# Patient Record
Sex: Male | Born: 1970 | Race: White | Hispanic: No | Marital: Married | Smoking: Never smoker
Health system: Southern US, Community
[De-identification: ages and names within clinical notes are randomized; demographics above are authoritative.]

## PROBLEM LIST (undated history)

## (undated) DIAGNOSIS — R002 Palpitations: Secondary | ICD-10-CM

## (undated) DIAGNOSIS — F419 Anxiety disorder, unspecified: Secondary | ICD-10-CM

## (undated) DIAGNOSIS — K219 Gastro-esophageal reflux disease without esophagitis: Secondary | ICD-10-CM

## (undated) DIAGNOSIS — R1013 Epigastric pain: Secondary | ICD-10-CM

## (undated) HISTORY — DX: Epigastric pain: R10.13

---

## 2004-08-09 ENCOUNTER — Ambulatory Visit: Payer: Self-pay | Admitting: Cardiology

## 2004-08-31 ENCOUNTER — Ambulatory Visit: Payer: Self-pay | Admitting: Cardiology

## 2004-11-13 ENCOUNTER — Encounter: Admission: RE | Admit: 2004-11-13 | Discharge: 2004-11-13 | Payer: Self-pay | Admitting: Neurological Surgery

## 2011-04-23 DIAGNOSIS — M5430 Sciatica, unspecified side: Secondary | ICD-10-CM | POA: Insufficient documentation

## 2011-05-17 ENCOUNTER — Other Ambulatory Visit: Payer: Self-pay | Admitting: Neurosurgery

## 2011-05-17 DIAGNOSIS — Z139 Encounter for screening, unspecified: Secondary | ICD-10-CM

## 2011-05-18 ENCOUNTER — Ambulatory Visit
Admission: RE | Admit: 2011-05-18 | Discharge: 2011-05-18 | Disposition: A | Discharge status: Home / Self Care | Payer: BC Managed Care – PPO | Source: Ambulatory Visit | Attending: Neurosurgery | Admitting: Neurosurgery

## 2011-05-18 DIAGNOSIS — Z139 Encounter for screening, unspecified: Secondary | ICD-10-CM

## 2011-05-22 ENCOUNTER — Other Ambulatory Visit: Payer: Self-pay | Admitting: Neurosurgery

## 2011-05-22 DIAGNOSIS — M549 Dorsalgia, unspecified: Secondary | ICD-10-CM

## 2011-05-30 ENCOUNTER — Ambulatory Visit
Admission: RE | Admit: 2011-05-30 | Discharge: 2011-05-30 | Disposition: A | Discharge status: Home / Self Care | Payer: BC Managed Care – PPO | Source: Ambulatory Visit | Attending: Neurosurgery | Admitting: Neurosurgery

## 2011-05-30 DIAGNOSIS — M549 Dorsalgia, unspecified: Secondary | ICD-10-CM

## 2011-05-30 MED ORDER — MORPHINE SULFATE 2 MG/ML IJ SOLN: 2.0000 mg | INTRAMUSCULAR | Status: DC | PRN

## 2011-05-30 MED ORDER — DIAZEPAM 5 MG PO TABS
5.0000 mg | ORAL_TABLET | Freq: Once | ORAL | Status: AC
  Administered 2011-05-30: 5 mg via ORAL

## 2011-05-30 MED ORDER — IOHEXOL 180 MG/ML  SOLN
20.0000 mL | Freq: Once | INTRAMUSCULAR | Status: AC | PRN
  Administered 2011-05-30: 20 mL via INTRATHECAL

## 2011-05-30 NOTE — Progress Notes (Addendum)
Explained discharge instructions to pt and wife, consent signed and valium to be given, pt did take his ativan this am.  0720 Dr. Phoebe Perch in to visit.

## 2011-05-31 ENCOUNTER — Telehealth: Payer: Self-pay | Admitting: Neurosurgery

## 2011-05-31 NOTE — Telephone Encounter (Signed)
Patient's wife called to report patient having HAs when he stands up and that go away when he lies down.  She states he already is doing another 24 hours of strict bedrest and forcing fluids, but she is wondering about a Blood Patch.  Told her to go ahead and inform Dr. Phoebe Perch of symptoms and to ask if he'd fax [place] order for BP so that if HA persisted in morning we could proceed with BP.  jkl

## 2011-06-01 ENCOUNTER — Other Ambulatory Visit: Payer: Self-pay | Admitting: Neurosurgery

## 2011-06-01 ENCOUNTER — Ambulatory Visit
Admission: RE | Admit: 2011-06-01 | Discharge: 2011-06-01 | Disposition: A | Discharge status: Home / Self Care | Payer: BC Managed Care – PPO | Source: Ambulatory Visit | Attending: Neurosurgery | Admitting: Neurosurgery

## 2011-06-01 DIAGNOSIS — G971 Other reaction to spinal and lumbar puncture: Secondary | ICD-10-CM

## 2011-06-01 MED ORDER — IOHEXOL 180 MG/ML  SOLN
1.0000 mL | Freq: Once | INTRAMUSCULAR | Status: AC | PRN
  Administered 2011-06-01: 1 mL via EPIDURAL

## 2011-06-01 NOTE — Progress Notes (Signed)
Pt has no c/o post blood patch.  Discharge instructions reviewed with pt and wife.

## 2011-12-11 DIAGNOSIS — R002 Palpitations: Secondary | ICD-10-CM | POA: Insufficient documentation

## 2011-12-11 DIAGNOSIS — M545 Low back pain, unspecified: Secondary | ICD-10-CM | POA: Insufficient documentation

## 2011-12-21 DIAGNOSIS — R079 Chest pain, unspecified: Secondary | ICD-10-CM

## 2015-07-01 DIAGNOSIS — K612 Anorectal abscess: Secondary | ICD-10-CM | POA: Insufficient documentation

## 2016-01-28 ENCOUNTER — Emergency Department (HOSPITAL_COMMUNITY)
Admission: EM | Admit: 2016-01-28 | Discharge: 2016-01-28 | Disposition: A | Discharge status: Home / Self Care | Payer: BLUE CROSS/BLUE SHIELD | Attending: Emergency Medicine | Admitting: Emergency Medicine

## 2016-01-28 ENCOUNTER — Emergency Department (HOSPITAL_COMMUNITY): Payer: BLUE CROSS/BLUE SHIELD

## 2016-01-28 ENCOUNTER — Encounter (HOSPITAL_COMMUNITY): Payer: Self-pay

## 2016-01-28 DIAGNOSIS — S60931A Unspecified superficial injury of right thumb, initial encounter: Secondary | ICD-10-CM | POA: Insufficient documentation

## 2016-01-28 DIAGNOSIS — Z23 Encounter for immunization: Secondary | ICD-10-CM | POA: Insufficient documentation

## 2016-01-28 DIAGNOSIS — Y999 Unspecified external cause status: Secondary | ICD-10-CM | POA: Insufficient documentation

## 2016-01-28 DIAGNOSIS — Y929 Unspecified place or not applicable: Secondary | ICD-10-CM | POA: Insufficient documentation

## 2016-01-28 DIAGNOSIS — S6991XA Unspecified injury of right wrist, hand and finger(s), initial encounter: Secondary | ICD-10-CM

## 2016-01-28 DIAGNOSIS — Y939 Activity, unspecified: Secondary | ICD-10-CM | POA: Insufficient documentation

## 2016-01-28 DIAGNOSIS — W25XXXA Contact with sharp glass, initial encounter: Secondary | ICD-10-CM | POA: Diagnosis not present

## 2016-01-28 HISTORY — DX: Anxiety disorder, unspecified: F41.9

## 2016-01-28 MED ORDER — TETANUS-DIPHTH-ACELL PERTUSSIS 5-2.5-18.5 LF-MCG/0.5 IM SUSP
0.5000 mL | Freq: Once | INTRAMUSCULAR | Status: AC
  Administered 2016-01-28: 0.5 mL via INTRAMUSCULAR
  Filled 2016-01-28: qty 0.5

## 2016-01-28 MED ORDER — HYDROCODONE-ACETAMINOPHEN 5-325 MG PO TABS: 1.0000 | ORAL_TABLET | Freq: Four times a day (QID) | ORAL | 0 refills | Status: DC | PRN

## 2016-01-28 MED ORDER — SODIUM CHLORIDE 0.9 % IV SOLN
3.0000 g | Freq: Once | INTRAVENOUS | Status: AC
  Administered 2016-01-28: 3 g via INTRAVENOUS
  Filled 2016-01-28: qty 3

## 2016-01-28 MED ORDER — SULFAMETHOXAZOLE-TRIMETHOPRIM 800-160 MG PO TABS: 1.0000 | ORAL_TABLET | Freq: Two times a day (BID) | ORAL | 0 refills | Status: AC

## 2016-01-28 MED ORDER — IBUPROFEN 400 MG PO TABS
800.0000 mg | ORAL_TABLET | Freq: Once | ORAL | Status: AC
Start: 2016-01-28 — End: 2016-01-28
  Administered 2016-01-28: 800 mg via ORAL
  Filled 2016-01-28: qty 2

## 2016-01-28 MED ORDER — LIDOCAINE HCL (PF) 1 % IJ SOLN
5.0000 mL | Freq: Once | INTRAMUSCULAR | Status: AC
  Administered 2016-01-28: 5 mL via INTRADERMAL
  Filled 2016-01-28: qty 5

## 2016-01-28 MED ORDER — CEPHALEXIN 500 MG PO CAPS: 500.0000 mg | ORAL_CAPSULE | Freq: Four times a day (QID) | ORAL | 0 refills | Status: DC

## 2016-01-28 NOTE — ED Triage Notes (Signed)
Patient here with right thumb pain x 2 days. Had laceration to same 2 days ago and then hit with hammer yesterday.  Small amount purulent drainage noted. No bleeding

## 2016-01-28 NOTE — ED Provider Notes (Signed)
[facility]PT Provider Note   CSN: [id number] Arrival date & time: 01/28/16  1840  By signing my name below, I, Modena JanskyAlbert Thayil, attest that this documentation has been prepared under the direction and in the presence of non-physician practitioner, Roxy Horsemanobert Jeromie Gainor, PA-C. Electronically Signed: Modena JanskyAlbert Thayil, Scribe. 01/28/2016. 6:54 PM.  History   Chief Complaint Chief Complaint  Patient presents with  . Finger Injury   The history is provided by the patient. No language interpreter was used.   HPI Comments: Steven Rivas is a 45 y.o. male who presents to the Emergency Department complaining of constant moderate right thumb pain that started 2 days ago. He reports he accidentally cut his thumb with glass 2 days ago and he also hit his right thumb with a hammer yesterday. He went to Urgent Care today and was sent to the ED. Associated symptoms include a wound and swelling to the injury site. His last tetanus shot was 7 years ago. Denies any other complaints.    Past Medical History:  Diagnosis Date  . Anxiety     There are no active problems to display for this patient.   History reviewed. No pertinent surgical history.     Home Medications    Prior to Admission medications   Not on File    Family History No family history on file.  Social History Social History  Substance Use Topics  . Smoking status: Never Smoker  . Smokeless tobacco: Never Used  . Alcohol use Not on file     Allergies   Review of patient's allergies indicates no known allergies.   Review of Systems Review of Systems  Constitutional: Negative for chills and fever.  Musculoskeletal: Positive for joint swelling and myalgias.  Skin: Positive for wound.  All other systems reviewed and are negative.    Physical Exam Updated Vital Signs BP 135/95 (BP Location: Left Arm)   Pulse 63   Temp 98.6 F (37 C) (Oral)   Resp 14   SpO2 99%   Physical Exam Physical Exam  Constitutional:  Pt appears well-developed and well-nourished. No distress.  HENT:  Head: Normocephalic and atraumatic.  Eyes: Conjunctivae are normal.  Neck: Normal range of motion.  Cardiovascular: Normal rate, regular rhythm and intact distal pulses.   Capillary refill < 3 sec  Pulmonary/Chest: Effort normal and breath sounds normal.  Musculoskeletal: Pt exhibits tenderness to palpation over posterior and inferior aspects, majority of pain at the location of the laceration from 2 days ago, mild purulent discharge. Pt exhibits no edema.  ROM: 3/5 limited by pain Neurological: Pt  is alert. Coordination normal.  Sensation 5/5 Strength 3/5 limited by pain  Skin: Skin is warm and dry. Pt is not diaphoretic.  No tenting of the skin Mild to moderate swelling, no erythema or cellulitis, some discharge from the laceration site from 2 days ago  Psychiatric: Pt has a normal mood and affect.  Nursing note and vitals reviewed.   ED Treatments / Results  DIAGNOSTIC STUDIES: Oxygen Saturation is 98% on RA, normal by my interpretation.    COORDINATION OF CARE: 6:58 PM- Pt advised of plan for treatment and pt agrees.  Labs (all labs ordered are listed, but only abnormal results are displayed) Labs Reviewed - No data to display  EKG  EKG Interpretation None       Radiology No results found.  Procedures Procedures (including critical care time)  Medications Ordered in ED Medications  Tdap (BOOSTRIX) injection 0.5 mL (not administered)  Initial Impression / Assessment and Plan / ED Course  I have reviewed the triage vital signs and the nursing notes.  Pertinent labs & imaging results that were available during my care of the patient were reviewed by me and considered in my medical decision making (see chart for details).  Clinical Course    Patient seen by and discussed with Dr. Adriana Simas, who recommends lancing the posterior aspect of the the thumb, giving a dose of IV abx and outpatient  trial.  If symptoms worsen, he will need to return for evaluation by hand surgery.  Patient understands and agrees with this plan.  Tdap updated.  INCISION AND DRAINAGE Performed by: Roxy Horseman Consent: Verbal consent obtained. Risks and benefits: risks, benefits and alternatives were discussed Type: abscess  Body area: right thumb  Anesthesia: Digital block  Incision was made with a scalpel.  Local anesthetic: lidocaine 1% without epinephrine  Anesthetic total: 4 ml  Complexity: complex Blunt dissection to break up loculations  Drainage: purulent  Drainage amount: mild  Packing material: not packed  Patient tolerance: Patient tolerated the procedure well with no immediate complications.   Will place patient in splint.  DC to home with bactrim and keflex.  Wound check in 2 days if not better, or sooner if symptoms worsen.    Final Clinical Impressions(s) / ED Diagnoses   Final diagnoses:  Finger injury, right, initial encounter    New Prescriptions Discharge Medication List as of 01/28/2016 10:18 PM    START taking these medications   Details  cephALEXin (KEFLEX) 500 MG capsule Take 1 capsule (500 mg total) by mouth 4 (four) times daily., Starting Sat 01/28/2016, Print    sulfamethoxazole-trimethoprim (BACTRIM DS,SEPTRA DS) 800-160 MG tablet Take 1 tablet by mouth 2 (two) times daily., Starting Sat 01/28/2016, Until Sat 02/04/2016, Print       I personally performed the services described in this documentation, which was scribed in my presence. The recorded information has been reviewed and is accurate.       Roxy Horseman, PA-C 01/28/16 0454    Donnetta Hutching, MD 01/31/16 [phone]

## 2016-01-28 NOTE — Progress Notes (Signed)
Pharmacy Antibiotic Note  Steven Rivas is a 45 y.o. male admitted on 01/28/2016 with thumb injury,  Pharmacy has been consulted for Unasyn dosing.  Planning I&D of thumb and then d/c home.  Plan: 1. Unasyn 3g IV x1 now. 2. Pharmacy will sign-off, please contact if questions.     Temp (24hrs), Avg:98.6 F (37 C), Min:98.6 F (37 C), Max:98.6 F (37 C)  No results for input(s): WBC, CREATININE, LATICACIDVEN, VANCOTROUGH, VANCOPEAK, VANCORANDOM, GENTTROUGH, GENTPEAK, GENTRANDOM, TOBRATROUGH, TOBRAPEAK, TOBRARND, AMIKACINPEAK, AMIKACINTROU, AMIKACIN in the last 168 hours.  CrCl cannot be calculated (Unknown ideal weight.).    No Known Allergies  Antimicrobials this admission: 9/16 Unasyn x 1  Dose adjustments this admission:   Microbiology results:   Thank you for allowing pharmacy to be a part of this patient's care.  Tad MooreJessica Nasim Habeeb, Pharm D, BCPS  Clinical Pharmacist Pager [phone]  01/28/2016 9:06 PM

## 2016-01-29 ENCOUNTER — Emergency Department (HOSPITAL_COMMUNITY)
Admission: EM | Admit: 2016-01-29 | Discharge: 2016-01-29 | Disposition: A | Discharge status: Home / Self Care | Payer: BLUE CROSS/BLUE SHIELD | Attending: Emergency Medicine | Admitting: Emergency Medicine

## 2016-01-29 ENCOUNTER — Encounter (HOSPITAL_COMMUNITY): Payer: Self-pay

## 2016-01-29 DIAGNOSIS — L089 Local infection of the skin and subcutaneous tissue, unspecified: Secondary | ICD-10-CM | POA: Diagnosis not present

## 2016-01-29 LAB — CBC WITH DIFFERENTIAL/PLATELET
BASOS ABS: 0 10*3/uL (ref 0.0–0.1)
BASOS PCT: 0 %
EOS ABS: 0.1 10*3/uL (ref 0.0–0.7)
EOS PCT: 1 %
HCT: 42.6 % (ref 39.0–52.0)
Hemoglobin: 14.5 g/dL (ref 13.0–17.0)
Lymphocytes Relative: 13 %
Lymphs Abs: 1.4 10*3/uL (ref 0.7–4.0)
MCH: 30 pg (ref 26.0–34.0)
MCHC: 34 g/dL (ref 30.0–36.0)
MCV: 88.2 fL (ref 78.0–100.0)
MONO ABS: 0.8 10*3/uL (ref 0.1–1.0)
MONOS PCT: 8 %
Neutro Abs: 8.2 10*3/uL — ABNORMAL HIGH (ref 1.7–7.7)
Neutrophils Relative %: 78 %
PLATELETS: 174 10*3/uL (ref 150–400)
RBC: 4.83 MIL/uL (ref 4.22–5.81)
RDW: 12.8 % (ref 11.5–15.5)
WBC: 10.4 10*3/uL (ref 4.0–10.5)

## 2016-01-29 LAB — BASIC METABOLIC PANEL
Anion gap: 9 (ref 5–15)
BUN: 12 mg/dL (ref 6–20)
CALCIUM: 8.9 mg/dL (ref 8.9–10.3)
CHLORIDE: 106 mmol/L (ref 101–111)
CO2: 23 mmol/L (ref 22–32)
CREATININE: 1.02 mg/dL (ref 0.61–1.24)
Glucose, Bld: 87 mg/dL (ref 65–99)
Potassium: 3.5 mmol/L (ref 3.5–5.1)
SODIUM: 138 mmol/L (ref 135–145)

## 2016-01-29 LAB — I-STAT CG4 LACTIC ACID, ED: LACTIC ACID, VENOUS: 0.91 mmol/L (ref 0.5–1.9)

## 2016-01-29 MED ORDER — LIDOCAINE HCL (PF) 1 % IJ SOLN
10.0000 mL | Freq: Once | INTRAMUSCULAR | Status: AC
  Administered 2016-01-29: 10 mL

## 2016-01-29 MED ORDER — LIDOCAINE HCL (PF) 1 % IJ SOLN
INTRAMUSCULAR | Status: AC
  Administered 2016-01-29: 10 mL
  Filled 2016-01-29: qty 10

## 2016-01-29 MED ORDER — ACETAMINOPHEN 500 MG PO TABS
1000.0000 mg | ORAL_TABLET | Freq: Once | ORAL | Status: AC
  Administered 2016-01-29: 1000 mg via ORAL
  Filled 2016-01-29: qty 2

## 2016-01-29 MED ORDER — HYDROCODONE-ACETAMINOPHEN 5-325 MG PO TABS
2.0000 | ORAL_TABLET | Freq: Once | ORAL | Status: DC
  Filled 2016-01-29: qty 2

## 2016-01-29 NOTE — Discharge Instructions (Addendum)
Keep your bandage on, keeping it clean and dry until midday on Tuesday.  At that time, take it off and pull the packing.  Cleanse the wound in the shower under running water with soap and water.  You may apply a new dry bandage over it as needed until there is no further drainage.  Cleanse it daily in the shower. Call Dr. Carollee Massedhompson's office if you have worsening signs of infection (fever greater than 101.5, spreading redness, increasing pain, increasing purulent drainage from the wound, etc.).  Please take ibuprofen, either 600 mg 4 times a day or 800 mg 3 times a day.  If he has pain unrelieved by ibuprofen, you may also take Tylenol according to the instructions on the packaging.   WORK STATUS: NO WORK FROM 01-27-16 THROUGH AT LEAST 01-31-16.  I WILL RE-EVALUATE HIM ON 02-01-16.

## 2016-01-29 NOTE — ED Triage Notes (Signed)
Patient here to have thumb rechecked after having ot drained last pm. Taking antibiotics but today had chills and fever of 100.5 pta. Here to have wound checked, NAD

## 2016-01-29 NOTE — ED Notes (Signed)
Dr. Thompson at bedside. 

## 2016-01-29 NOTE — Consult Note (Signed)
ORTHOPAEDIC CONSULTATION HISTORY & PHYSICAL REQUESTING PHYSICIAN: Steven OctaveStephen Rancour, MD  Chief Complaint: Right thumb infection  HPI: Steven Rivas is a 45 y.o. male who injured the dorsal radial aspect of his right thumb near the level of the IP joint with sheet metal on this past Thursday.  He had some drainage and redness over the subsequent one to 2 days, and as a complicating factor, struck the thumb with a hammer.  Initially he had made efforts to close the wound with superglue.  Ultimately he remove the superglue and more fluid was expressed.  He was evaluated yesterday in emergency department where he had developed a small infectious blister of the skin which was unroofed.  He was placed on Keflex and Bactrim.  He has developed some increased soreness and had a temperature elevation today to 100.5.  Past Medical History:  Diagnosis Date  . Anxiety    History reviewed. No pertinent surgical history. Social History   Social History  . Marital status: Married    Spouse name: N/A  . Number of children: N/A  . Years of education: N/A   Social History Main Topics  . Smoking status: Never Smoker  . Smokeless tobacco: Never Used  . Alcohol use None  . Drug use: Unknown  . Sexual activity: Not Asked   Other Topics Concern  . None   Social History Narrative  . None   No family history on file. No Known Allergies Prior to Admission medications   Medication Sig Start Date End Date Taking? Authorizing Provider  cephALEXin (KEFLEX) 500 MG capsule Take 1 capsule (500 mg total) by mouth 4 (four) times daily. 01/28/16   Roxy Horsemanobert Browning, PA-C  HYDROcodone-acetaminophen (NORCO/VICODIN) 5-325 MG tablet Take 1-2 tablets by mouth every 6 (six) hours as needed. 01/28/16   Roxy Horsemanobert Browning, PA-C  sulfamethoxazole-trimethoprim (BACTRIM DS,SEPTRA DS) 800-160 MG tablet Take 1 tablet by mouth 2 (two) times daily. 01/28/16 02/04/16  Roxy Horsemanobert Browning, PA-C   Dg Finger Thumb Right  Result Date:  01/28/2016 CLINICAL DATA:  Struck RIGHT thumb on piece of brittle metal on Tuesday then smashed thumb with a hammer 2 days ago, swelling, pus drainage at site of injury, question foreign body EXAM: RIGHT THUMB 2+V COMPARISON:  None FINDINGS: Soft tissue swelling RIGHT thumb. Osseous mineralization normal. Joint spaces preserved. No acute fracture, dislocation, or bone destruction. No radiopaque foreign body or soft tissue gas. IMPRESSION: Soft tissue swelling without acute bony abnormality or radiopaque foreign body. Electronically Signed   By: Ulyses SouthwardMark  Boles M.D.   On: 01/28/2016 20:00    Positive ROS: All other systems have been reviewed and were otherwise negative with the exception of those mentioned in the HPI and as above.  Physical Exam: Vitals: Refer to EMR. Constitutional:  WD, WN, NAD HEENT:  NCAT, EOMI Neuro/Psych:  Alert & oriented to person, place, and time; appropriate mood & affect Lymphatic: No generalized extremity edema or lymphadenopathy Extremities / MSK:  The extremities are normal with respect to appearance, ranges of motion, joint stability, muscle strength/tone, sensation, & perfusion except as otherwise noted:  There is a curvilinear wound on the dorsal radial aspect of the right thumb that is proximally based, near the level of the IP joint.  He can actively flex and extend the thumb, and generate good tension.  He reports the most discomfort around the level of the IP flexion crease on the radial side of the thumb and even somewhat volarly.  The pulp is not tense.  There is no fluctuance.  There is no significant spreading redness.  WBC was 10.4.  Assessment: Right thumb laceration, likely with some degree of subcutaneous abscess versus possible septic IP joint  Plan: After obtaining verbal consent, digital block with lidocaine was performed by me.  A tourniquet was applied to the base of the thumb.  At first, I opened the wound with spreading dissection.  Some scant amount  of cloudy thin fluid was obtained.  I extended the wound slightly proximally and distally and further expose the wound.  It did not appear to extend more deeply although I did spread sufficiently encase it was deeper extension.  I also spread into the subcutaneous tissues.  There was no significant extension of the wound or fluid tracking volarly.  I irrigated the wound very copiously and then left the packing.  The tourniquet was released and hemostasis obtained with direct pressure before bandage was applied.  He will be discharged with instructions on wound care, what to do if his condition worsens, and should continue on his antibiotics.  He was instructed in anti-inflammatories supplement with Tylenol and he has a prescription for other analgesics if needed.  We will plan for him to pull the packing on Tuesday and will evaluate him in the office on Wednesday to see whether he can proceed to work on Wednesday evening.  Questions were invited and answered.  Cliffton Asters Janee Morn, MD      [facility] [address] [place], [place]  [address] Office: [phone] Mobile: [phone]  01/29/2016, 10:05 PM

## 2016-01-29 NOTE — ED Provider Notes (Signed)
[facility] Provider Note   CSN: [id number] Arrival date & time: 01/29/16  1805   By signing my name below, I, Christel Mormon, attest that this documentation has been prepared under the direction and in the presence of Roxy Horseman, PA-C. Electronically Signed: Christel Mormon, Scribe. 01/29/2016. 7:02 PM.   History   Chief Complaint Chief Complaint  Patient presents with  . Wound Infection    The history is provided by the patient. No language interpreter was used.   HPI Comments:  Steven Rivas is a 45 y.o. male who presents to the Emergency Department, here to have a wound on his R thumb re-checked after being seen in the ED yesterday. Pt reports throbbing pain and draining from the wound site today. Can move it more. Pt was given antibiotics and states that today after he took the antibiotics he felt ill, began having chills and noted a fever of 100.5. Pt took 4 Advil PTA which lowered his fever. Pt denies any other complaints.   Past Medical History:  Diagnosis Date  . Anxiety     There are no active problems to display for this patient.   History reviewed. No pertinent surgical history.     Home Medications    Prior to Admission medications   Medication Sig Start Date End Date Taking? Authorizing Provider  cephALEXin (KEFLEX) 500 MG capsule Take 1 capsule (500 mg total) by mouth 4 (four) times daily. 01/28/16   Roxy Horseman, PA-C  HYDROcodone-acetaminophen (NORCO/VICODIN) 5-325 MG tablet Take 1-2 tablets by mouth every 6 (six) hours as needed. 01/28/16   Roxy Horseman, PA-C  sulfamethoxazole-trimethoprim (BACTRIM DS,SEPTRA DS) 800-160 MG tablet Take 1 tablet by mouth 2 (two) times daily. 01/28/16 02/04/16  Roxy Horseman, PA-C    Family History No family history on file.  Social History Social History  Substance Use Topics  . Smoking status: Never Smoker  . Smokeless tobacco: Never Used  . Alcohol use Not on file     Allergies   Review of  patient's allergies indicates no known allergies.   Review of Systems Review of Systems  Musculoskeletal: Positive for arthralgias.  Skin: Positive for wound.     Physical Exam Updated Vital Signs BP 136/85 (BP Location: Left Arm)   Pulse 66   Temp 99.1 F (37.3 C) (Oral)   Resp 20   SpO2 97%   Physical Exam  Constitutional: He is oriented to person, place, and time. He appears well-developed and well-nourished.  HENT:  Head: Normocephalic and atraumatic.  Eyes: Conjunctivae and EOM are normal.  Neck: Normal range of motion.  Cardiovascular: Normal rate.   Pulmonary/Chest: Effort normal.  Abdominal: He exhibits no distension.  Musculoskeletal: Normal range of motion.  Pain with passive and active range of motion  Neurological: He is alert and oriented to person, place, and time.  Skin: Skin is dry.  Psychiatric: He has a normal mood and affect. His behavior is normal. Judgment and thought content normal.  Nursing note and vitals reviewed.        ED Treatments / Results  DIAGNOSTIC STUDIES:  Oxygen Saturation is 97% on RA, normal by my interpretation.    COORDINATION OF CARE:  7:02 PM Discussed treatment plan with pt at bedside and pt agreed to plan.   Labs (all labs ordered are listed, but only abnormal results are displayed) Labs Reviewed  CBC WITH DIFFERENTIAL/PLATELET - Abnormal; Notable for the following:       Result Value   Neutro  Abs 8.2 (*)    All other components within normal limits  I-STAT CG4 LACTIC ACID, ED    EKG  EKG Interpretation None       Radiology Dg Finger Thumb Right  Result Date: 01/28/2016 CLINICAL DATA:  Struck RIGHT thumb on piece of brittle metal on Tuesday then smashed thumb with a hammer 2 days ago, swelling, pus drainage at site of injury, question foreign body EXAM: RIGHT THUMB 2+V COMPARISON:  None FINDINGS: Soft tissue swelling RIGHT thumb. Osseous mineralization normal. Joint spaces preserved. No acute fracture,  dislocation, or bone destruction. No radiopaque foreign body or soft tissue gas. IMPRESSION: Soft tissue swelling without acute bony abnormality or radiopaque foreign body. Electronically Signed   By: Ulyses SouthwardMark  Boles M.D.   On: 01/28/2016 20:00    Procedures Procedures (including critical care time)  Medications Ordered in ED Medications - No data to display   Initial Impression / Assessment and Plan / ED Course  I have reviewed the triage vital signs and the nursing notes.  Pertinent labs & imaging results that were available during my care of the patient were reviewed by me and considered in my medical decision making (see chart for details).  Clinical Course   Patient seen in ED yesterday by myself. Had a pustule, posterior aspect of his left thumb which was draining pus. I lanced this at that time. Patient has returned despite taking antibiotics, claiming that the thumb is very painful, pain is worsened with passive and active range of motion. There is still purulence drained. He also reports fever to 100.5 today and feeling body aches and chills.  Patient seen by and discussed with Dr. Manus Gunningancour, who recommends hand surgery consultation given that the purulence is immediately over the joint, and patient has limited range of motion due to severe pain both actively and passively.  Concern for infected IP joint.    Appreciate consultation from Dr. Janee Mornhompson, who opened and explored the subcutaneous dorsal thumb in the ED.  Continue with keflex and bactrim.  F/u in Dr. Carollee Massedhompson's office on Wednesday.  Final Clinical Impressions(s) / ED Diagnoses   Final diagnoses:  Infection of thumb    New Prescriptions New Prescriptions   No medications on file   I personally performed the services described in this documentation, which was scribed in my presence. The recorded information has been reviewed and is accurate.       Roxy Horsemanobert Cerrone Debold, PA-C 01/29/16 2216    Glynn OctaveStephen Rancour,  MD 01/30/16 [phone]

## 2016-01-29 NOTE — ED Triage Notes (Signed)
PT received IV antibx on 11-27-15

## 2016-05-21 DIAGNOSIS — F410 Panic disorder [episodic paroxysmal anxiety] without agoraphobia: Secondary | ICD-10-CM | POA: Insufficient documentation

## 2016-09-11 MED FILL — LORazepam 0.5 MG TABS: 0.5 | 90 days supply | Qty: 180 | Fill #0

## 2016-09-20 ENCOUNTER — Encounter: Payer: Self-pay | Admitting: General Surgery

## 2016-09-20 ENCOUNTER — Ambulatory Visit (INDEPENDENT_AMBULATORY_CARE_PROVIDER_SITE_OTHER): Payer: BLUE CROSS/BLUE SHIELD | Admitting: General Surgery

## 2016-09-20 VITALS — BP 128/81 | HR 63 | Temp 98.7°F | Resp 18 | Ht 72.0 in | Wt 212.0 lb

## 2016-09-20 DIAGNOSIS — K603 Anal fistula: Secondary | ICD-10-CM

## 2016-09-20 NOTE — Progress Notes (Signed)
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  He wanted to wait on surgery due to his concerns about general anesthesia.  He states he has had intermittent drainage from the small cyst along the lower part of his anus since that time.  Occasionally causes him pain.  He currently has no pain.  He denies any passage of air through the fistula.  He denies any incontinence or underwear sewing.  No blood per rectum is noted.  Occasional small amount of purulent drainage is present.  He denies any fevers.  He now presents to schedule surgery. Past Medical History:  Diagnosis Date  . Anxiety     No past surgical history on file.  No family history on file.  Current Outpatient Prescriptions on File Prior to Visit  Medication Sig Dispense Refill  . cephALEXin (KEFLEX) 500 MG capsule Take 1 capsule (500 mg total) by mouth 4 (four) times daily. 28 capsule 0  . HYDROcodone-acetaminophen (NORCO/VICODIN) 5-325 MG tablet Take 1-2 tablets by mouth every 6 (six) hours as needed. 5 tablet 0   No current facility-administered medications on file prior to visit.     No Known Allergies  History  Alcohol use Not on file    History  Smoking Status  . Never Smoker  Smokeless Tobacco  . Never Used    Review of Systems  Constitutional: Negative.   HENT: Negative.   Eyes: Negative.   Respiratory: Negative.   Cardiovascular: Negative.   Gastrointestinal: Negative.   Genitourinary: Negative.   Musculoskeletal: Positive for joint pain.  Skin: Negative.   Neurological: Negative.   Endo/Heme/Allergies: Negative.   Psychiatric/Behavioral: Negative.     Objective   Vitals:   09/20/16 1024  BP: 128/81  Pulse: 63  Resp: 18  Temp: 98.7 F (37.1 C)    Physical Exam  Constitutional: He is oriented to person, place, and time and well-developed, well-nourished, and in no distress.  HENT:   Head: Normocephalic and atraumatic.  Neck: Normal range of motion. Neck supple.  Cardiovascular: Normal rate, regular rhythm and normal heart sounds.   No murmur heard. Pulmonary/Chest: Effort normal and breath sounds normal. He has no wheezes. He has no rales.  Genitourinary:  Genitourinary Comments: Rectal examination reveals a small cyst with minimal induration at approximately the 7 o'clock position on the anus.  Sphincter tone is normal.  No drainage is noted.  Neurological: He is alert and oriented to person, place, and time.  Skin: Skin is warm and dry.  Vitals reviewed.   Assessment    Anal fistula Plan    patient is scheduled for an anal fistulotomy on 10/01/2016.  The risks and benefits of the procedure including bleeding, infection, Incontinence, and the possibility of recurrence of the fistula were fully explained to the patient, who gave informed consent.

## 2016-09-20 NOTE — Patient Instructions (Signed)
Anal Fistulotomy Anal fistulotomy is a surgical procedure to open and drain an anal fistula. An anal fistula is an abnormal tunnel that develops between the bowel and the skin near the outside of the anus, where stool (feces) comes out. During this procedure, the fistula is opened up and the contents are drained to promote healing. Tell a health care provider about:  Any allergies you have.  All medicines you are taking, including vitamins, herbs, eye drops, creams, and over-the-counter medicines.  Any problems you or family members have had with anesthetic medicines.  Any blood disorders you have.  Any surgeries you have had.  Any medical conditions you have.  Whether you are pregnant or may be pregnant. What are the risks? Generally, this is a safe procedure. However, problems may occur, including:  Infection.  Bleeding.  Allergic reactions to medicines or dyes.  Damage to other structures or organs.  Not being able to control when you have bowel movements (incontinence).  Being unable to empty your bladder (urinary retention). What happens before the procedure? Staying hydrated  Follow instructions from your health care provider about hydration, which may include:  Up to 2 hours before the procedure - you may continue to drink clear liquids, such as water, clear fruit juice, black coffee, and plain tea. Eating and drinking restrictions  Follow instructions from your health care provider about eating and drinking, which may include:  8 hours before the procedure - stop eating heavy meals or foods such as meat, fried foods, or fatty foods.  6 hours before the procedure - stop eating light meals or foods, such as toast or cereal.  6 hours before the procedure - stop drinking milk or drinks that contain milk.  2 hours before the procedure - stop drinking clear liquids. Medicines   Ask your health care provider about:  Changing or stopping your regular medicines. This  is especially important if you are taking diabetes medicines or blood thinners.  Taking medicines such as aspirin and ibuprofen. These medicines can thin your blood. Do not take these medicines before your procedure if your health care provider instructs you not to.  You may be given antibiotic medicine to help prevent infection. General Instructions   You may have an exam or testing.  You may have a blood or urine sample taken.  You may be instructed to take a laxative or use an enema to clean your bowels before surgery.  Plan to have someone take you home from the hospital or clinic.  If you will be going home right after the procedure, plan to have someone with you for 24 hours. What happens during the procedure?  To reduce your risk of infection:  Your health care team will wash or sanitize their hands.  Your skin will be washed with soap.  An IV tube will be inserted into one of your veins to give you fluids and medicines.  You will be given one or more of the following:  A medicine to help you relax (sedative).  A medicine to numb the area (local anesthetic).  A medicine to make you fall asleep (general anesthetic).  Your surgeon will locate the internal opening of your fistula.  An incision will be made in the fistula opening. The incision may extend into the muscles around the anus (sphincter muscles).  The fistula will be cut open and drained.  The sides of the fistula may be stitched (sutured) to the sides of the incision.  Gauze bandages (  dressings) may be placed inside of the fistula. The procedure may vary among health care providers and hospitals. What happens after the procedure?  Your blood pressure, heart rate, breathing rate, and blood oxygen level will be monitored until the medicines you were given have worn off.  You may have to wear compression stockings. These stockings help to prevent blood clots and reduce swelling in your legs.  Do not drive  for 24 hours if you received a sedative.  You may continue to receive fluids and medicines through an IV tube.  You may have some bleeding from the surgical area. You may have to wear an absorbent pad. This information is not intended to replace advice given to you by your health care provider. Make sure you discuss any questions you have with your health care provider. Document Released: 08/23/2015 Document Revised: 12/08/2015 Document Reviewed: 08/23/2015 Elsevier Interactive Patient Education  2017 [employer] Anal Fistula An anal fistula is an abnormal tunnel that develops between the bowel and the skin near the outside of the anus, where stool (feces) comes out. The anus has many tiny glands that make lubricating fluid. Sometimes, these glands become plugged and infected, and that can cause a fluid-filled pocket (abscess) to form. An anal fistula often develops after this infection or abscess. What are the causes? In most cases, an anal fistula is caused by a past or current anal abscess. Other causes include:  A complication of surgery.  Trauma to the rectal area.  Radiation to the area.  Medical conditions or diseases, such as:  Chronic inflammatory bowel disease, such as Crohn disease or ulcerative colitis.  Colon cancer or rectal cancer.  Diverticular disease, such as diverticulitis.  An STD (sexually transmitted disease), such as gonorrhea, chlamydia, or syphilis.  An infection that is caused by HIV (human immunodeficiency virus).  Foreign body in the rectum. What are the signs or symptoms? Symptoms of this condition include:  Throbbing or constant pain that may be worse while you are sitting.  Swelling or irritation around the anus.  Drainage of pus or blood from an opening near the anus.  Pain with bowel movements.  Fever or chills. How is this diagnosed? Your health care provider will examine the area to find the openings of the anal fistula and the  fistula tract. The external opening of the anal fistula may be seen during a physical exam. You may also have tests, including:  An exam of the rectal area with a gloved hand (digital rectal exam).  An exam with a probe or scope to help locate the internal opening of the fistula.  Imaging tests to find the exact location and path of the fistula. These tests may include X-rays, an ultrasound, a CT scan, or MRI. The path is made visible by a dye that is injected into the fistula opening. You may have other tests to find the cause of the anal fistula. How is this treated? The most common treatment for an anal fistula is surgery. The type of surgery that is used will depend on where the fistula is located and how complex the fistula is. Surgical options include:  A fistulotomy. The whole fistula is opened up, and the contents are drained to promote healing.  Seton placement. A silk string (seton) is placed into the fistula during a fistulotomy. This helps to drain any infection to promote healing.  Advancement flap procedure. Tissue is removed from your rectum or the skin around the anus and is attached  to the opening of the fistula.  Bioprosthetic plug. A cone-shaped plug is made from your tissue and is used to block the opening of the fistula. Some anal fistulas do not require surgery. A nonsurgical treatment option involves injecting a fibrin glue to seal the fistula. You also may be prescribed an antibiotic medicine to treat an infection. Follow these instructions at home: Medicines   Take over-the-counter and prescription medicines only as told by your health care provider.  If you were prescribed an antibiotic medicine, take it as told by your health care provider. Do not stop taking the antibiotic even if you start to feel better.  Use a stool softener or a laxative if told to do so by your health care provider. General instructions   Eat a high-fiber diet as told by your health care  provider. This can help to prevent constipation.  Drink enough fluid to keep your urine clear or pale yellow.  Take a warm sitz bath for 15-20 minutes, 3-4 times per day, or as told by your health care provider. Sitz baths can ease your pain and discomfort and help with healing.  Follow good hygiene to keep the anal area as clean and dry as possible. Use wet toilet paper or a moist towelette after each bowel movement.  Keep all follow-up visits as told by your health care provider. This is important. Contact a health care provider if:  You have increased pain that is not controlled with medicines.  You have new redness or swelling around the anal area.  You have new fluid, blood, or pus coming from the anal area.  You have tenderness or warmth around the anal area. Get help right away if:  You have a fever.  You have severe pain.  You have chills or diarrhea.  You have severe problems urinating or having a bowel movement. This information is not intended to replace advice given to you by your health care provider. Make sure you discuss any questions you have with your health care provider. Document Released: 04/12/2008 Document Revised: 10/06/2015 Document Reviewed: 07/26/2014 Elsevier Interactive Patient Education  2017 [employer].

## 2016-09-20 NOTE — H&P (Signed)
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  He wanted to wait on surgery due to his concerns about general anesthesia.  He states he has had intermittent drainage from the small cyst along the lower part of his anus since that time.  Occasionally causes him pain.  He currently has no pain.  He denies any passage of air through the fistula.  He denies any incontinence or underwear sewing.  No blood per rectum is noted.  Occasional small amount of purulent drainage is present.  He denies any fevers.  He now presents to schedule surgery. Past Medical History:  Diagnosis Date  . Anxiety     No past surgical history on file.  No family history on file.  Current Outpatient Prescriptions on File Prior to Visit  Medication Sig Dispense Refill  . cephALEXin (KEFLEX) 500 MG capsule Take 1 capsule (500 mg total) by mouth 4 (four) times daily. 28 capsule 0  . HYDROcodone-acetaminophen (NORCO/VICODIN) 5-325 MG tablet Take 1-2 tablets by mouth every 6 (six) hours as needed. 5 tablet 0   No current facility-administered medications on file prior to visit.     No Known Allergies  History  Alcohol use Not on file    History  Smoking Status  . Never Smoker  Smokeless Tobacco  . Never Used    Review of Systems  Constitutional: Negative.   HENT: Negative.   Eyes: Negative.   Respiratory: Negative.   Cardiovascular: Negative.   Gastrointestinal: Negative.   Genitourinary: Negative.   Musculoskeletal: Positive for joint pain.  Skin: Negative.   Neurological: Negative.   Endo/Heme/Allergies: Negative.   Psychiatric/Behavioral: Negative.     Objective   Vitals:   09/20/16 1024  BP: 128/81  Pulse: 63  Resp: 18  Temp: 98.7 F (37.1 C)    Physical Exam  Constitutional: He is oriented to person, place, and time and well-developed, well-nourished, and in no distress.  HENT:   Head: Normocephalic and atraumatic.  Neck: Normal range of motion. Neck supple.  Cardiovascular: Normal rate, regular rhythm and normal heart sounds.   No murmur heard. Pulmonary/Chest: Effort normal and breath sounds normal. He has no wheezes. He has no rales.  Genitourinary:  Genitourinary Comments: Rectal examination reveals a small cyst with minimal induration at approximately the 7 o'clock position on the anus.  Sphincter tone is normal.  No drainage is noted.  Neurological: He is alert and oriented to person, place, and time.  Skin: Skin is warm and dry.  Vitals reviewed.   Assessment    Anal fistula Plan    patient is scheduled for an anal fistulotomy on 10/01/2016.  The risks and benefits of the procedure including bleeding, infection, Incontinence, and the possibility of recurrence of the fistula were fully explained to the patient, who gave informed consent.  

## 2016-09-21 NOTE — Patient Instructions (Signed)
Steven Rivas  09/21/2016     @PREFPERIOPPHARMACY @   Your procedure is scheduled on  10/01/2016   [facility] to [facility] at  [facility]  [facility].M.  Call this number if you have problems the morning of surgery:  [phone]   Remember:  Do not eat food or drink liquids after midnight.  Take these medicines the morning of surgery with A SIP OF WATER  Hydrocodone.   Do not wear jewelry, make-up or nail polish.  Do not wear lotions, powders, or perfumes, or deoderant.  Do not shave 48 hours prior to surgery.  Men may shave face and neck.  Do not bring valuables to the hospital.  [facility] any belongings or valuables.  Contacts, dentures or bridgework may not be worn into surgery.  Leave your suitcase in the car.  After surgery it may be brought to your room.  For patients admitted to the hospital, discharge time will be determined by your treatment team.  Patients discharged the day of surgery will not be allowed to drive home.   Name and phone number of your driver:   family Special instructions:  None  Please read over the following fact sheets that you were given. Anesthesia Post-op Instructions and Care and Recovery After Surgery       Anal Fistulotomy Anal fistulotomy is a surgical procedure to open and drain an anal fistula. An anal fistula is an abnormal tunnel that develops between the bowel and the skin near the outside of the anus, where stool (feces) comes out. During this procedure, the fistula is opened up and the contents are drained to promote healing. Tell a health care provider about:  Any allergies you have.  All medicines you are taking, including vitamins, herbs, eye drops, creams, and over-the-counter medicines.  Any problems you or family members have had with anesthetic medicines.  Any blood disorders you have.  Any surgeries you have had.  Any medical conditions you have.  Whether you are pregnant or may be  pregnant. What are the risks? Generally, this is a safe procedure. However, problems may occur, including:  Infection.  Bleeding.  Allergic reactions to medicines or dyes.  Damage to other structures or organs.  Not being able to control when you have bowel movements (incontinence).  Being unable to empty your bladder (urinary retention). What happens before the procedure? Staying hydrated  Follow instructions from your health care provider about hydration, which may include:  Up to 2 hours before the procedure - you may continue to drink clear liquids, such as water, clear fruit juice, black coffee, and plain tea. Eating and drinking restrictions  Follow instructions from your health care provider about eating and drinking, which may include:  8 hours before the procedure - stop eating heavy meals or foods such as meat, fried foods, or fatty foods.  6 hours before the procedure - stop eating light meals or foods, such as toast or cereal.  6 hours before the procedure - stop drinking milk or drinks that contain milk.  2 hours before the procedure - stop drinking clear liquids. Medicines   Ask your health care provider about:  Changing or stopping your regular medicines. This is especially important if you are taking diabetes medicines or blood thinners.  Taking medicines such as aspirin and ibuprofen. These medicines can thin your blood. Do not take these medicines before your procedure if your health  care provider instructs you not to.  You may be given antibiotic medicine to help prevent infection. General Instructions   You may have an exam or testing.  You may have a blood or urine sample taken.  You may be instructed to take a laxative or use an enema to clean your bowels before surgery.  Plan to have someone take you home from the hospital or clinic.  If you will be going home right after the procedure, plan to have someone with you for 24 hours. What happens  during the procedure?  To reduce your risk of infection:  Your health care team will wash or sanitize their hands.  Your skin will be washed with soap.  An IV tube will be inserted into one of your veins to give you fluids and medicines.  You will be given one or more of the following:  A medicine to help you relax (sedative).  A medicine to numb the area (local anesthetic).  A medicine to make you fall asleep (general anesthetic).  Your surgeon will locate the internal opening of your fistula.  An incision will be made in the fistula opening. The incision may extend into the muscles around the anus (sphincter muscles).  The fistula will be cut open and drained.  The sides of the fistula may be stitched (sutured) to the sides of the incision.  Gauze bandages (dressings) may be placed inside of the fistula. The procedure may vary among health care providers and hospitals. What happens after the procedure?  Your blood pressure, heart rate, breathing rate, and blood oxygen level will be monitored until the medicines you were given have worn off.  You may have to wear compression stockings. These stockings help to prevent blood clots and reduce swelling in your legs.  Do not drive for 24 hours if you received a sedative.  You may continue to receive fluids and medicines through an IV tube.  You may have some bleeding from the surgical area. You may have to wear an absorbent pad. This information is not intended to replace advice given to you by your health care provider. Make sure you discuss any questions you have with your health care provider. Document Released: 08/23/2015 Document Revised: 12/08/2015 Document Reviewed: 08/23/2015 Elsevier Interactive Patient Education  2017 [employer]  Anal Fistulotomy, Care After This sheet gives you information about how to care for yourself after your procedure. Your health care provider may also give you more specific instructions.  If you have problems or questions, contact your health care provider. What can I expect after the procedure? After the procedure, it is common to have:  Some pain, discomfort, and swelling.  Increased pain during bowel movements.  Some bleeding from the wound. Follow these instructions at home: Medicines   Take over-the-counter and prescription medicines only as told by your health care provider.  If you were prescribed an antibiotic medicine, take it as told by your health care provider. Do not stop taking the antibiotic even if you start to feel better. Activity   Do not drive for 24 hours if you received a medicine to help you relax (sedative) during your procedure.  Do not drive or use heavy machinery while taking prescription pain medicine.  Do not lift anything that is heavier than the limit that your health care provider tells you until he or she says that it is safe.  Rest as told by your health care provider. It is also important to move around  in between periods of rest. Get up and move around as often as you can tolerate.  Return to your normal activities as told by your health care provider. Ask your health care provider what activities are safe for you. Lifestyle   Limit alcohol intake to no more than 1 drink a day for nonpregnant women and 2 drinks a day for men. One drink equals 12 oz of beer, 5 oz of wine, or 1 oz of hard liquor.  Do not use any products that contain nicotine or tobacco, such as cigarettes and e-cigarettes. If you need help quitting, ask your health care provider. Incision care    Follow instructions from your health care provider about how to take care of your incision.  If gauze (dressing) was placed in your incision during surgery, you may be told to remove it yourself after a certain period of time. Make sure you wash your hands with soap and water before and after removing your dressing. If soap and water are not available, use hand  sanitizer.  Do not remove your dressing unless told by your health care provider. You may be told to allow the dressing to come out with your first bowel movement after surgery, instead of removing the dressing.  Leave stitches (sutures), skin glue, or adhesive strips in place. These skin closures may need to stay in place for 2 weeks or longer. Do not remove adhesive strips completely unless your health care provider tells you to do that.  Keep the incision clean and dry.  Check your incision area every day for signs of infection. Check for:  More redness, swelling, or pain.  More fluid or blood.  Warmth.  Pus or a bad smell.  After having a bowel movement, clean the incision area using one of the following methods:  Gently wipe with a moist towelette.  Gently wipe with mild soap and water.  Take a shower.  Take a sitz bath. This is a warm water bath that is taken while you are sitting down.  You may apply ice to the incision area to reduce discomfort:  Put ice in a plastic bag.  Place a towel between your skin and the bag.  Leave the ice on for 20 minutes, 2-3 times a day. Eating and drinking   Follow instructions from your health care provider about eating or drinking restrictions.  Drink enough fluid to keep your urine clear or pale yellow.  Eat lots of fiber. Fiber helps regulate bowel movements and prevent constipation. Foods that contain a lot of fiber include:  Fruits.  Vegetables.  Whole grains. General instructions   Do not swim or use a hot tub until your health care provider says that this is safe.  If you have bleeding from your wound, wear an absorbent pad and change it often.  Wear compression stockings as told by your health care provider. These stockings help to prevent blood clots and reduce swelling in your legs.  Keep all follow-up visits as told by your health care provider. This is important. Contact a health care provider if:  You have  more redness, swelling, or pain around your incision area.  Your incision area feels warm to the touch or firm.  You have pus or a bad smell coming from your incision area.  You have a fever or chills.  You develop swelling or tenderness in your groin area.  You cannot control when you have bowel movements.  You are leaking stool (incontinent).  You have trouble urinating.  You have pain that does not get better with medicine. Get help right away if:  You have severe pain in your:  Wound area.  Abdomen.  You have sudden chest pain.  You become weak or you pass out (faint).  You have more fluid or blood coming from your incision.  You have bleeding from your incision that soaks 2 or more pads during 24 hours. This information is not intended to replace advice given to you by your health care provider. Make sure you discuss any questions you have with your health care provider. Document Released: 08/23/2015 Document Revised: 12/08/2015 Document Reviewed: 08/23/2015 Elsevier Interactive Patient Education  2017 [employer] Inc[employer]  General Anesthesia, Adult General anesthesia is the use of medicines to make a person "go to sleep" (be unconscious) for a medical procedure. General anesthesia is often recommended when a procedure:  Is long.  Requires you to be still or in an unusual position.  Is major and can cause you to lose blood.  Is impossible to do without general anesthesia. The medicines used for general anesthesia are called general anesthetics. In addition to making you sleep, the medicines:  Prevent pain.  Control your blood pressure.  Relax your muscles. Tell a health care provider about:  Any allergies you have.  All medicines you are taking, including vitamins, herbs, eye drops, creams, and over-the-counter medicines.  Any problems you or family members have had with anesthetic medicines.  Types of anesthetics you have had in the past.  Any bleeding  disorders you have.  Any surgeries you have had.  Any medical conditions you have.  Any history of heart or lung conditions, such as heart failure, sleep apnea, or chronic obstructive pulmonary disease (COPD).  Whether you are pregnant or may be pregnant.  Whether you use tobacco, alcohol, marijuana, or street drugs.  Any history of Financial planner.  Any history of depression or anxiety. What are the risks? Generally, this is a safe procedure. However, problems may occur, including:  Allergic reaction to anesthetics.  Lung and heart problems.  Inhaling food or liquids from your stomach into your lungs (aspiration).  Injury to nerves.  Waking up during your procedure and being unable to move (rare).  Extreme agitation or a state of mental confusion (delirium) when you wake up from the anesthetic.  Air in the bloodstream, which can lead to stroke. These problems are more likely to develop if you are having a major surgery or if you have an advanced medical condition. You can prevent some of these complications by answering all of your health care provider's questions thoroughly and by following all pre-procedure instructions. General anesthesia can cause side effects, including:  Nausea or vomiting  A sore throat from the breathing tube.  Feeling cold or shivery.  Feeling tired, washed out, or achy.  Sleepiness or drowsiness.  Confusion or agitation. What happens before the procedure? Staying hydrated  Follow instructions from your health care provider about hydration, which may include:  Up to 2 hours before the procedure - you may continue to drink clear liquids, such as water, clear fruit juice, black coffee, and plain tea. Eating and drinking restrictions  Follow instructions from your health care provider about eating and drinking, which may include:  8 hours before the procedure - stop eating heavy meals or foods such as meat, fried foods, or fatty  foods.  6 hours before the procedure - stop eating light meals or foods, such as  toast or cereal.  6 hours before the procedure - stop drinking milk or drinks that contain milk.  2 hours before the procedure - stop drinking clear liquids. Medicines   Ask your health care provider about:  Changing or stopping your regular medicines. This is especially important if you are taking diabetes medicines or blood thinners.  Taking medicines such as aspirin and ibuprofen. These medicines can thin your blood. Do not take these medicines before your procedure if your health care provider instructs you not to.  Taking new dietary supplements or medicines. Do not take these during the week before your procedure unless your health care provider approves them.  If you are told to take a medicine or to continue taking a medicine on the day of the procedure, take the medicine with sips of water. General instructions    Ask if you will be going home the same day, the following day, or after a longer hospital stay.  Plan to have someone take you home.  Plan to have someone stay with you for the first 24 hours after you leave the hospital or clinic.  For 3-6 weeks before the procedure, try not to use any tobacco products, such as cigarettes, chewing tobacco, and e-cigarettes.  You may brush your teeth on the morning of the procedure, but make sure to spit out the toothpaste. What happens during the procedure?  You will be given anesthetics through a mask and through an IV tube in one of your veins.  You may receive medicine to help you relax (sedative).  As soon as you are asleep, a breathing tube may be used to help you breathe.  An anesthesia specialist will stay with you throughout the procedure. He or she will help keep you comfortable and safe by continuing to give you medicines and adjusting the amount of medicine that you get. He or she will also watch your blood pressure, pulse, and oxygen  levels to make sure that the anesthetics do not cause any problems.  If a breathing tube was used to help you breathe, it will be removed before you wake up. The procedure may vary among health care providers and hospitals. What happens after the procedure?  You will wake up, often slowly, after the procedure is complete, usually in a recovery area.  Your blood pressure, heart rate, breathing rate, and blood oxygen level will be monitored until the medicines you were given have worn off.  You may be given medicine to help you calm down if you feel anxious or agitated.  If you will be going home the same day, your health care provider may check to make sure you can stand, drink, and urinate.  Your health care providers will treat your pain and side effects before you go home.  Do not drive for 24 hours if you received a sedative.  You may:  Feel nauseous and vomit.  Have a sore throat.  Have mental slowness.  Feel cold or shivery.  Feel sleepy.  Feel tired.  Feel sore or achy, even in parts of your body where you did not have surgery. This information is not intended to replace advice given to you by your health care provider. Make sure you discuss any questions you have with your health care provider. Document Released: 08/07/2007 Document Revised: 10/11/2015 Document Reviewed: 04/14/2015 Elsevier Interactive Patient Education  2017 [employer] Inc. General Anesthesia, Adult, Care After These instructions provide you with information about caring for yourself after your procedure.  Your health care provider may also give you more specific instructions. Your treatment has been planned according to current medical practices, but problems sometimes occur. Call your health care provider if you have any problems or questions after your procedure. What can I expect after the procedure? After the procedure, it is common to have:  Vomiting.  A sore throat.  Mental slowness. It is  common to feel:  Nauseous.  Cold or shivery.  Sleepy.  Tired.  Sore or achy, even in parts of your body where you did not have surgery. Follow these instructions at home: For at least 24 hours after the procedure:   Do not:  Participate in activities where you could fall or become injured.  Drive.  Use heavy machinery.  Drink alcohol.  Take sleeping pills or medicines that cause drowsiness.  Make important decisions or sign legal documents.  Take care of children on your own.  Rest. Eating and drinking   If you vomit, drink water, juice, or soup when you can drink without vomiting.  Drink enough fluid to keep your urine clear or pale yellow.  Make sure you have little or no nausea before eating solid foods.  Follow the diet recommended by your health care provider. General instructions   Have a responsible adult stay with you until you are awake and alert.  Return to your normal activities as told by your health care provider. Ask your health care provider what activities are safe for you.  Take over-the-counter and prescription medicines only as told by your health care provider.  If you smoke, do not smoke without supervision.  Keep all follow-up visits as told by your health care provider. This is important. Contact a health care provider if:  You continue to have nausea or vomiting at home, and medicines are not helpful.  You cannot drink fluids or start eating again.  You cannot urinate after 8-12 hours.  You develop a skin rash.  You have fever.  You have increasing redness at the site of your procedure. Get help right away if:  You have difficulty breathing.  You have chest pain.  You have unexpected bleeding.  You feel that you are having a life-threatening or urgent problem. This information is not intended to replace advice given to you by your health care provider. Make sure you discuss any questions you have with your health care  provider. Document Released: 08/06/2000 Document Revised: 10/03/2015 Document Reviewed: 04/14/2015 Elsevier Interactive Patient Education  2017 [employer].

## 2016-09-25 ENCOUNTER — Ambulatory Visit: Payer: Self-pay | Admitting: General Surgery

## 2016-09-26 ENCOUNTER — Encounter (HOSPITAL_COMMUNITY)
Admission: RE | Admit: 2016-09-26 | Discharge: 2016-09-26 | Disposition: A | Discharge status: Home / Self Care | Payer: BLUE CROSS/BLUE SHIELD | Source: Ambulatory Visit | Attending: General Surgery | Admitting: General Surgery

## 2016-09-26 ENCOUNTER — Encounter (HOSPITAL_COMMUNITY): Payer: Self-pay

## 2016-09-26 DIAGNOSIS — K603 Anal fistula: Secondary | ICD-10-CM | POA: Diagnosis not present

## 2016-09-26 DIAGNOSIS — Z01818 Encounter for other preprocedural examination: Secondary | ICD-10-CM | POA: Diagnosis present

## 2016-09-26 HISTORY — DX: Palpitations: R00.2

## 2016-09-26 HISTORY — DX: Gastro-esophageal reflux disease without esophagitis: K21.9

## 2016-09-26 LAB — BASIC METABOLIC PANEL
Anion gap: 7 (ref 5–15)
BUN: 16 mg/dL (ref 6–20)
CHLORIDE: 106 mmol/L (ref 101–111)
CO2: 25 mmol/L (ref 22–32)
CREATININE: 1.1 mg/dL (ref 0.61–1.24)
Calcium: 8.6 mg/dL — ABNORMAL LOW (ref 8.9–10.3)
GFR calc Af Amer: >60 mL/min (ref >60)
GFR calc non Af Amer: >60 mL/min (ref >60)
GLUCOSE: 107 mg/dL — AB (ref 65–99)
POTASSIUM: 3.4 mmol/L — AB (ref 3.5–5.1)
Sodium: 138 mmol/L (ref 135–145)

## 2016-09-26 LAB — CBC WITH DIFFERENTIAL/PLATELET
Basophils Absolute: 0 10*3/uL (ref 0.0–0.1)
Basophils Relative: 1 %
EOS ABS: 0.2 10*3/uL (ref 0.0–0.7)
EOS PCT: 3 %
HCT: 43.3 % (ref 39.0–52.0)
Hemoglobin: 15.2 g/dL (ref 13.0–17.0)
LYMPHS ABS: 1.9 10*3/uL (ref 0.7–4.0)
LYMPHS PCT: 29 %
MCH: 30.5 pg (ref 26.0–34.0)
MCHC: 35.1 g/dL (ref 30.0–36.0)
MCV: 86.9 fL (ref 78.0–100.0)
MONOS PCT: 7 %
Monocytes Absolute: 0.4 10*3/uL (ref 0.1–1.0)
Neutro Abs: 3.8 10*3/uL (ref 1.7–7.7)
Neutrophils Relative %: 60 %
PLATELETS: 188 10*3/uL (ref 150–400)
RBC: 4.98 MIL/uL (ref 4.22–5.81)
RDW: 13.1 % (ref 11.5–15.5)
WBC: 6.3 10*3/uL (ref 4.0–10.5)

## 2016-10-01 ENCOUNTER — Ambulatory Visit (HOSPITAL_COMMUNITY)
Admission: RE | Admit: 2016-10-01 | Discharge: 2016-10-01 | Disposition: A | Discharge status: Home / Self Care | Payer: BLUE CROSS/BLUE SHIELD | Source: Ambulatory Visit | Attending: General Surgery | Admitting: General Surgery

## 2016-10-01 ENCOUNTER — Ambulatory Visit (HOSPITAL_COMMUNITY): Payer: BLUE CROSS/BLUE SHIELD | Admitting: Anesthesiology

## 2016-10-01 ENCOUNTER — Encounter (HOSPITAL_COMMUNITY): Payer: Self-pay | Admitting: Anesthesiology

## 2016-10-01 ENCOUNTER — Other Ambulatory Visit: Payer: Self-pay

## 2016-10-01 ENCOUNTER — Encounter (HOSPITAL_COMMUNITY)
Admission: RE | Disposition: A | Discharge status: Home / Self Care | Payer: Self-pay | Source: Ambulatory Visit | Attending: General Surgery

## 2016-10-01 DIAGNOSIS — F419 Anxiety disorder, unspecified: Secondary | ICD-10-CM | POA: Diagnosis not present

## 2016-10-01 DIAGNOSIS — K219 Gastro-esophageal reflux disease without esophagitis: Secondary | ICD-10-CM | POA: Diagnosis not present

## 2016-10-01 DIAGNOSIS — K648 Other hemorrhoids: Secondary | ICD-10-CM | POA: Insufficient documentation

## 2016-10-01 DIAGNOSIS — K603 Anal fistula: Secondary | ICD-10-CM | POA: Diagnosis present

## 2016-10-01 DIAGNOSIS — Z79899 Other long term (current) drug therapy: Secondary | ICD-10-CM | POA: Insufficient documentation

## 2016-10-01 HISTORY — PX: ANAL FISTULOTOMY: SHX6423

## 2016-10-01 HISTORY — PX: HEMORRHOID SURGERY: SHX153

## 2016-10-01 SURGERY — ANAL FISTULOTOMY
Anesthesia: General

## 2016-10-01 MED ORDER — EPHEDRINE SULFATE 50 MG/ML IJ SOLN
INTRAMUSCULAR | Status: AC
  Filled 2016-10-01: qty 1

## 2016-10-01 MED ORDER — BUPIVACAINE HCL (PF) 0.5 % IJ SOLN
INTRAMUSCULAR | Status: AC
  Filled 2016-10-01: qty 30

## 2016-10-01 MED ORDER — KETOROLAC TROMETHAMINE 30 MG/ML IJ SOLN
INTRAMUSCULAR | Status: AC
  Filled 2016-10-01: qty 1

## 2016-10-01 MED ORDER — CEFOTETAN DISODIUM-DEXTROSE 2-2.08 GM-% IV SOLR
2.0000 g | INTRAVENOUS | Status: AC
  Administered 2016-10-01: 2 g via INTRAVENOUS

## 2016-10-01 MED ORDER — MIDAZOLAM HCL 2 MG/2ML IJ SOLN
INTRAMUSCULAR | Status: AC
  Filled 2016-10-01: qty 2

## 2016-10-01 MED ORDER — CHLORHEXIDINE GLUCONATE CLOTH 2 % EX PADS: 6.0000 | MEDICATED_PAD | Freq: Once | CUTANEOUS | Status: DC

## 2016-10-01 MED ORDER — PROPOFOL 10 MG/ML IV BOLUS
INTRAVENOUS | Status: AC
  Filled 2016-10-01: qty 20

## 2016-10-01 MED ORDER — HYDROMORPHONE HCL 1 MG/ML IJ SOLN: 0.2500 mg | INTRAMUSCULAR | Status: DC | PRN

## 2016-10-01 MED ORDER — PHENYLEPHRINE 40 MCG/ML (10ML) SYRINGE FOR IV PUSH (FOR BLOOD PRESSURE SUPPORT)
PREFILLED_SYRINGE | INTRAVENOUS | Status: AC
  Filled 2016-10-01: qty 10

## 2016-10-01 MED ORDER — OXYCODONE-ACETAMINOPHEN 7.5-325 MG PO TABS: 1.0000 | ORAL_TABLET | ORAL | 0 refills | Status: DC | PRN

## 2016-10-01 MED ORDER — SODIUM CHLORIDE 0.9 % IR SOLN
Status: DC | PRN
  Administered 2016-10-01: 1000 mL

## 2016-10-01 MED ORDER — MIDAZOLAM HCL 2 MG/2ML IJ SOLN
1.0000 mg | INTRAMUSCULAR | Status: AC | PRN
  Administered 2016-10-01 (×2): 2 mg via INTRAVENOUS
  Filled 2016-10-01: qty 2

## 2016-10-01 MED ORDER — BUPIVACAINE LIPOSOME 1.3 % IJ SUSP
INTRAMUSCULAR | Status: DC | PRN
  Administered 2016-10-01: 17 mL

## 2016-10-01 MED ORDER — LIDOCAINE VISCOUS 2 % MT SOLN
OROMUCOSAL | Status: AC
  Filled 2016-10-01: qty 15

## 2016-10-01 MED ORDER — CEFOTETAN DISODIUM-DEXTROSE 2-2.08 GM-% IV SOLR
INTRAVENOUS | Status: AC
  Filled 2016-10-01: qty 50

## 2016-10-01 MED ORDER — FENTANYL CITRATE (PF) 100 MCG/2ML IJ SOLN
25.0000 ug | INTRAMUSCULAR | Status: DC | PRN
  Administered 2016-10-01: 25 ug via INTRAVENOUS

## 2016-10-01 MED ORDER — SODIUM CHLORIDE 0.9 % IJ SOLN
INTRAMUSCULAR | Status: AC
  Filled 2016-10-01: qty 10

## 2016-10-01 MED ORDER — LIDOCAINE HCL (CARDIAC) 20 MG/ML IV SOLN
INTRAVENOUS | Status: DC | PRN
  Administered 2016-10-01: 40 mg via INTRAVENOUS

## 2016-10-01 MED ORDER — HYDROMORPHONE HCL 1 MG/ML IJ SOLN
INTRAMUSCULAR | Status: AC
  Filled 2016-10-01: qty 1

## 2016-10-01 MED ORDER — FENTANYL CITRATE (PF) 250 MCG/5ML IJ SOLN
INTRAMUSCULAR | Status: AC
  Filled 2016-10-01: qty 5

## 2016-10-01 MED ORDER — LACTATED RINGERS IV SOLN
INTRAVENOUS | Status: DC
  Administered 2016-10-01: 08:00:00 via INTRAVENOUS

## 2016-10-01 MED ORDER — BUPIVACAINE LIPOSOME 1.3 % IJ SUSP
INTRAMUSCULAR | Status: AC
  Filled 2016-10-01: qty 20

## 2016-10-01 MED ORDER — SUCCINYLCHOLINE CHLORIDE 20 MG/ML IJ SOLN
INTRAMUSCULAR | Status: AC
  Filled 2016-10-01: qty 1

## 2016-10-01 MED ORDER — FENTANYL CITRATE (PF) 100 MCG/2ML IJ SOLN
INTRAMUSCULAR | Status: AC
  Filled 2016-10-01: qty 2

## 2016-10-01 MED ORDER — FENTANYL CITRATE (PF) 100 MCG/2ML IJ SOLN
INTRAMUSCULAR | Status: DC | PRN
  Administered 2016-10-01 (×2): 50 ug via INTRAVENOUS

## 2016-10-01 MED ORDER — PROPOFOL 10 MG/ML IV BOLUS
INTRAVENOUS | Status: DC | PRN
  Administered 2016-10-01: 50 mg via INTRAVENOUS
  Administered 2016-10-01: 170 mg via INTRAVENOUS
  Administered 2016-10-01: 30 mg via INTRAVENOUS

## 2016-10-01 MED ORDER — KETOROLAC TROMETHAMINE 30 MG/ML IJ SOLN
30.0000 mg | Freq: Once | INTRAMUSCULAR | Status: AC
  Administered 2016-10-01: 30 mg via INTRAVENOUS

## 2016-10-01 MED ORDER — LIDOCAINE HCL (PF) 1 % IJ SOLN
INTRAMUSCULAR | Status: AC
  Filled 2016-10-01: qty 5

## 2016-10-01 SURGICAL SUPPLY — 26 items
BAG HAMPER (MISCELLANEOUS) ×4 IMPLANT
CLOTH BEACON ORANGE TIMEOUT ST (SAFETY) ×4 IMPLANT
COVER LIGHT HANDLE STERIS (MISCELLANEOUS) ×8 IMPLANT
DRAPE PROXIMA HALF (DRAPES) ×4 IMPLANT
ELECT REM PT RETURN 9FT ADLT (ELECTROSURGICAL) ×4
ELECTRODE REM PT RTRN 9FT ADLT (ELECTROSURGICAL) ×2 IMPLANT
FORMALIN 10 PREFIL 120ML (MISCELLANEOUS) ×4 IMPLANT
GAUZE SPONGE 4X4 12PLY STRL (GAUZE/BANDAGES/DRESSINGS) ×4 IMPLANT
GLOVE BIOGEL PI IND STRL 7.0 (GLOVE) ×2 IMPLANT
GLOVE BIOGEL PI INDICATOR 7.0 (GLOVE) ×4
GLOVE ECLIPSE 6.5 STRL STRAW (GLOVE) ×2 IMPLANT
GLOVE SURG SS PI 7.5 STRL IVOR (GLOVE) ×8 IMPLANT
GOWN STRL REUS W/TWL LRG LVL3 (GOWN DISPOSABLE) ×4 IMPLANT
GOWN STRL REUS W/TWL XL LVL3 (GOWN DISPOSABLE) ×4 IMPLANT
KIT ROOM TURNOVER AP CYSTO (KITS) ×4 IMPLANT
LIGASURE IMPACT 36 18CM CVD LR (INSTRUMENTS) ×2 IMPLANT
MANIFOLD NEPTUNE II (INSTRUMENTS) ×4 IMPLANT
NEEDLE HYPO 22GX1.5 SAFETY (NEEDLE) ×2 IMPLANT
NS IRRIG 1000ML POUR BTL (IV SOLUTION) ×4 IMPLANT
PACK PERI GYN (CUSTOM PROCEDURE TRAY) ×4 IMPLANT
PAD ARMBOARD 7.5X6 YLW CONV (MISCELLANEOUS) ×4 IMPLANT
SET BASIN LINEN APH (SET/KITS/TRAYS/PACK) ×4 IMPLANT
SURGILUBE 3G PEEL PACK STRL (MISCELLANEOUS) ×4 IMPLANT
SUT CHROMIC 3 0 SH 27 (SUTURE) ×2 IMPLANT
SUT VIC AB 2-0 CT2 27 (SUTURE) ×2 IMPLANT
SYRINGE 20CC LL (MISCELLANEOUS) ×2 IMPLANT

## 2016-10-01 NOTE — Anesthesia Postprocedure Evaluation (Signed)
Anesthesia Post Note  Patient: Steven RenoJerry C Siwek  Procedure(s) Performed: Procedure(s) (LRB): ANAL FISTULOTOMY (N/A) HEMORRHOIDECTOMY  Anesthesia Type: General Level of consciousness: awake, oriented and patient cooperative Pain management: pain level controlled Vital Signs Assessment: post-procedure vital signs reviewed and stable Respiratory status: spontaneous breathing and patient connected to face mask oxygen Cardiovascular status: stable Postop Assessment: no signs of nausea or vomiting Anesthetic complications: no     Last Vitals:  Vitals:   10/01/16 0930 10/01/16 0936  BP: 107/71   Pulse: (!) 59 (!) 55  Resp: (!) 9 (!) 9  Temp:      Last Pain:  Vitals:   10/01/16 0911  TempSrc:   PainSc: Asleep                 [name] A

## 2016-10-01 NOTE — Transfer of Care (Signed)
Immediate Anesthesia Transfer of Care Note  Patient: Steven Rivas  Procedure(s) Performed: Procedure(s): ANAL FISTULOTOMY (N/A) HEMORRHOIDECTOMY  Patient Location: PACU  Anesthesia Type:General  Level of Consciousness: sedated  Airway & Oxygen Therapy: Patient Spontanous Breathing and Patient connected to face mask oxygen  Post-op Assessment: Report given to RN and Post -op Vital signs reviewed and stable  Post vital signs: Reviewed and stable  Last Vitals:  Vitals:   10/01/16 0733  BP: 135/88  Pulse: 70  Resp: 15  Temp: 36.8 C    Last Pain:  Vitals:   10/01/16 0733  TempSrc: Oral      Patients Stated Pain Goal: 5 (10/01/16 0733)  Complications: No apparent anesthesia complications

## 2016-10-01 NOTE — Anesthesia Procedure Notes (Signed)
Procedure Name: LMA Insertion Date/Time: 10/01/2016 8:13 AM Performed by: Pernell DupreADAMS, Britnay Magnussen A Pre-anesthesia Checklist: Patient identified, Timeout performed, Emergency Drugs available, Suction available and Patient being monitored Patient Re-evaluated:Patient Re-evaluated prior to inductionOxygen Delivery Method: Circle system utilized Preoxygenation: Pre-oxygenation with 100% oxygen Intubation Type: IV induction Ventilation: Mask ventilation without difficulty LMA: LMA inserted LMA Size: 4.0 Number of attempts: 1 Placement Confirmation: positive ETCO2 and breath sounds checked- equal and bilateral Tube secured with: Tape Dental Injury: Teeth and Oropharynx as per pre-operative assessment

## 2016-10-01 NOTE — Discharge Instructions (Signed)
Anal Fistulotomy, Care After °This sheet gives you information about how to care for yourself after your procedure. Your health care provider may also give you more specific instructions. If you have problems or questions, contact your health care provider. °What can I expect after the procedure? °After the procedure, it is common to have: °· Some pain, discomfort, and swelling. °· Increased pain during bowel movements. °· Some bleeding from the wound. °Follow these instructions at home: °Medicines  °· Take over-the-counter and prescription medicines only as told by your health care provider. °· If you were prescribed an antibiotic medicine, take it as told by your health care provider. Do not stop taking the antibiotic even if you start to feel better. °Activity  °· Do not drive for 24 hours if you received a medicine to help you relax (sedative) during your procedure. °· Do not drive or use heavy machinery while taking prescription pain medicine. °· Do not lift anything that is heavier than the limit that your health care provider tells you until he or she says that it is safe. °· Rest as told by your health care provider. It is also important to move around in between periods of rest. Get up and move around as often as you can tolerate. °· Return to your normal activities as told by your health care provider. Ask your health care provider what activities are safe for you. °Lifestyle  °· Limit alcohol intake to no more than 1 drink a day for nonpregnant women and 2 drinks a day for men. One drink equals 12 oz of beer, 5 oz of wine, or 1½ oz of hard liquor. °· Do not use any products that contain nicotine or tobacco, such as cigarettes and e-cigarettes. If you need help quitting, ask your health care provider. °Incision care  ° °· Follow instructions from your health care provider about how to take care of your incision. °¨ If gauze (dressing) was placed in your incision during surgery, you may be told to remove it  yourself after a certain period of time. Make sure you wash your hands with soap and water before and after removing your dressing. If soap and water are not available, use hand sanitizer. °¨ Do not remove your dressing unless told by your health care provider. You may be told to allow the dressing to come out with your first bowel movement after surgery, instead of removing the dressing. °¨ Leave stitches (sutures), skin glue, or adhesive strips in place. These skin closures may need to stay in place for 2 weeks or longer. Do not remove adhesive strips completely unless your health care provider tells you to do that. °· Keep the incision clean and dry. °· Check your incision area every day for signs of infection. Check for: °¨ More redness, swelling, or pain. °¨ More fluid or blood. °¨ Warmth. °¨ Pus or a bad smell. °· After having a bowel movement, clean the incision area using one of the following methods: °¨ Gently wipe with a moist towelette. °¨ Gently wipe with mild soap and water. °¨ Take a shower. °¨ Take a sitz bath. This is a warm water bath that is taken while you are sitting down. °· You may apply ice to the incision area to reduce discomfort: °¨ Put ice in a plastic bag. °¨ Place a towel between your skin and the bag. °¨ Leave the ice on for 20 minutes, 2-3 times a day. °Eating and drinking  °· Follow instructions from   your health care provider about eating or drinking restrictions. °· Drink enough fluid to keep your urine clear or pale yellow. °· Eat lots of fiber. Fiber helps regulate bowel movements and prevent constipation. Foods that contain a lot of fiber include: °¨ Fruits. °¨ Vegetables. °¨ Whole grains. °General instructions  °· Do not swim or use a hot tub until your health care provider says that this is safe. °· If you have bleeding from your wound, wear an absorbent pad and change it often. °· Wear compression stockings as told by your health care provider. These stockings help to prevent  blood clots and reduce swelling in your legs. °· Keep all follow-up visits as told by your health care provider. This is important. °Contact a health care provider if: °· You have more redness, swelling, or pain around your incision area. °· Your incision area feels warm to the touch or firm. °· You have pus or a bad smell coming from your incision area. °· You have a fever or chills. °· You develop swelling or tenderness in your groin area. °· You cannot control when you have bowel movements. °· You are leaking stool (incontinent). °· You have trouble urinating. °· You have pain that does not get better with medicine. °Get help right away if: °· You have severe pain in your: °¨ Wound area. °¨ Abdomen. °· You have sudden chest pain. °· You become weak or you pass out (faint). °· You have more fluid or blood coming from your incision. °· You have bleeding from your incision that soaks 2 or more pads during 24 hours. °This information is not intended to replace advice given to you by your health care provider. Make sure you discuss any questions you have with your health care provider. °Document Released: 08/23/2015 Document Revised: 12/08/2015 Document Reviewed: 08/23/2015 °Elsevier Interactive Patient Education © 2017 [employer] ° °

## 2016-10-01 NOTE — Op Note (Signed)
Patient:  Steven Rivas  DOB:  [birth year]  MRN:  [id number]   Preop Diagnosis:  Anal fistula  Postop Diagnosis:  Same, internal hemorrhoid  Procedure:  Anal fistulotomy with internal hemorrhoidectomy  Surgeon:  Steven Rivas, M.D.  Anes:  Gen.  Indications:  Patient is a 46 year old white male who has had intermittent episodes of drainage from a pinpoint area around his anus. He now presents for a fistulotomy. The risks and benefits of the procedure including bleeding, infection, and the possibility of recurrence of the fistula were fully explained to the patient, who gave informed consent.  Procedure note:  The patient was placed in the lithotomy position after general anesthesia was administered. The perineum was prepped and draped using usual sterile technique with Betadine. Surgical site confirmation was performed.  On rectal examination, the patient had an indurated area at the 6:00 position. 2 openings were noted, one outside the anal verge and 1 at the anal verge. Using a probe, I was able to connect these 2. There was some mucosal in nature but did not involve the external sphincter. This was unroofed. Any granulomatous tissue was removed using scissor excision. A bleeding was controlled using Bovie electrocautery. Care was taken to avoid the external sphincter mechanism. The mucosal edges were reapproximated loosely using 3-0 chromic gut interrupted sutures. No purulent drainage was present. No further tracking at the dentate line was noted. An internal hemorrhoid was noted in this region and this was excised using the LigaSure. It was sent to pathology for examination. Exparel was instilled into the perineum.  All tape and needle counts were correct at the end of the procedure. The patient was awakened and transferred to PACU in stable condition.  Complications:  None  EBL:  Minimal  Specimen:  Hemorrhoid

## 2016-10-01 NOTE — Anesthesia Preprocedure Evaluation (Signed)
Anesthesia Evaluation  Patient identified by MRN, date of birth, ID band Patient awake    Reviewed: Allergy & Precautions, NPO status , Patient's Chart, lab work & pertinent test results  Airway Mallampati: I  TM Distance: >3 FB Neck ROM: Full    Dental  (+) Teeth Intact   Pulmonary neg pulmonary ROS,    breath sounds clear to auscultation       Cardiovascular + dysrhythmias ("palpitatons")  Rhythm:Regular Rate:Normal     Neuro/Psych PSYCHIATRIC DISORDERS Anxiety negative neurological ROS     GI/Hepatic GERD  Medicated and Controlled,  Endo/Other    Renal/GU      Musculoskeletal   Abdominal   Peds  Hematology   Anesthesia Other Findings   Reproductive/Obstetrics                             Anesthesia Physical Anesthesia Plan  ASA: II  Anesthesia Plan: General   Post-op Pain Management:    Induction: Intravenous  Airway Management Planned: LMA  Additional Equipment:   Intra-op Plan:   Post-operative Plan: Extubation in OR  Informed Consent: I have reviewed the patients History and Physical, chart, labs and discussed the procedure including the risks, benefits and alternatives for the proposed anesthesia with the patient or authorized representative who has indicated his/her understanding and acceptance.     Plan Discussed with:   Anesthesia Plan Comments:         Anesthesia Quick Evaluation

## 2016-10-01 NOTE — Interval H&P Note (Signed)
History and Physical Interval Note:  10/01/2016 7:41 AM  Roseanne RenoJerry C Vanover  has presented today for surgery, with the diagnosis of anal fissure  The various methods of treatment have been discussed with the patient and family. After consideration of risks, benefits and other options for treatment, the patient has consented to  Procedure(s): ANAL FISTULOTOMY (N/A) as a surgical intervention .  The patient's history has been reviewed, patient examined, no change in status, stable for surgery.  I have reviewed the patient's chart and labs.  Questions were answered to the patient's satisfaction.     Franky MachoMark Saylee Sherrill

## 2016-10-02 ENCOUNTER — Encounter (HOSPITAL_COMMUNITY): Payer: Self-pay | Admitting: General Surgery

## 2016-10-09 ENCOUNTER — Encounter: Payer: Self-pay | Admitting: General Surgery

## 2016-10-09 ENCOUNTER — Ambulatory Visit (INDEPENDENT_AMBULATORY_CARE_PROVIDER_SITE_OTHER): Payer: Self-pay | Admitting: General Surgery

## 2016-10-09 VITALS — BP 115/80 | HR 57 | Temp 97.1°F | Resp 18 | Ht 72.0 in | Wt 211.0 lb

## 2016-10-09 DIAGNOSIS — Z09 Encounter for follow-up examination after completed treatment for conditions other than malignant neoplasm: Secondary | ICD-10-CM

## 2016-10-09 NOTE — Progress Notes (Signed)
Subjective:     Roseanne RenoJerry C Youngberg  Status post anal fistulotomy. Is doing well. Did have some urinary hesitancy, but this is clearing. No incontinence noted. Objective:    BP 115/80   Pulse (!) 57   Temp 97.1 F (36.2 C)   Resp 18   Ht 6' (1.829 m)   Wt 211 lb (95.7 kg)   BMI 28.62 kg/m   General:  alert, cooperative and no distress  Rectal incision healing well.     Assessment:    Doing well postoperatively.    Plan:  May soak in warm bath to keep the area clean. Follow-up as needed.

## 2016-10-11 ENCOUNTER — Encounter (HOSPITAL_COMMUNITY): Payer: Self-pay | Admitting: General Surgery

## 2016-10-12 DIAGNOSIS — R300 Dysuria: Secondary | ICD-10-CM | POA: Insufficient documentation

## 2016-10-12 DIAGNOSIS — N410 Acute prostatitis: Secondary | ICD-10-CM | POA: Insufficient documentation

## 2016-10-23 ENCOUNTER — Encounter: Payer: Self-pay | Admitting: General Surgery

## 2016-10-23 ENCOUNTER — Ambulatory Visit (INDEPENDENT_AMBULATORY_CARE_PROVIDER_SITE_OTHER): Payer: Self-pay | Admitting: General Surgery

## 2016-10-23 VITALS — BP 127/79 | HR 52 | Temp 96.5°F | Resp 18 | Ht 66.0 in | Wt 211.0 lb

## 2016-10-23 DIAGNOSIS — Z09 Encounter for follow-up examination after completed treatment for conditions other than malignant neoplasm: Secondary | ICD-10-CM

## 2016-10-23 NOTE — Progress Notes (Signed)
Subjective:     Steven Rivas  Status post anal fistulectomy. Having small amount of serous drainage. No incontinence. Objective:    BP 127/79   Pulse (!) 52   Temp (!) 96.5 F (35.8 C)   Resp 18   Ht 5\' 6"  (1.676 m)   Wt 211 lb (95.7 kg)   BMI 34.06 kg/m   General:  alert, cooperative and no distress  Anal examination reveals a small punctate wound with granulation tissue present at incision site. Silver nitrate was applied.     Assessment:    Doing well postoperatively.    Plan:   If patient continues to have drainage over the next few weeks, he was instructed to return to the office. He understands this and agrees. Otherwise, follow-up as needed.

## 2017-03-15 DIAGNOSIS — L719 Rosacea, unspecified: Secondary | ICD-10-CM | POA: Insufficient documentation

## 2017-05-17 ENCOUNTER — Encounter (INDEPENDENT_AMBULATORY_CARE_PROVIDER_SITE_OTHER): Payer: Self-pay

## 2017-05-17 ENCOUNTER — Encounter (INDEPENDENT_AMBULATORY_CARE_PROVIDER_SITE_OTHER): Payer: Self-pay | Admitting: Internal Medicine

## 2017-05-30 ENCOUNTER — Ambulatory Visit (INDEPENDENT_AMBULATORY_CARE_PROVIDER_SITE_OTHER): Payer: Commercial Managed Care - PPO | Admitting: Internal Medicine

## 2017-05-30 ENCOUNTER — Encounter (INDEPENDENT_AMBULATORY_CARE_PROVIDER_SITE_OTHER): Payer: Self-pay | Admitting: Internal Medicine

## 2017-05-30 ENCOUNTER — Encounter (INDEPENDENT_AMBULATORY_CARE_PROVIDER_SITE_OTHER): Payer: Self-pay | Admitting: *Deleted

## 2017-05-30 VITALS — BP 138/80 | HR 72 | Temp 98.7°F | Ht 72.0 in | Wt 210.7 lb

## 2017-05-30 DIAGNOSIS — R1013 Epigastric pain: Secondary | ICD-10-CM

## 2017-05-30 DIAGNOSIS — K219 Gastro-esophageal reflux disease without esophagitis: Secondary | ICD-10-CM

## 2017-05-30 HISTORY — DX: Gastro-esophageal reflux disease without esophagitis: K21.9

## 2017-05-30 HISTORY — DX: Epigastric pain: R10.13

## 2017-05-30 MED ORDER — PANTOPRAZOLE SODIUM 40 MG PO TBEC: 40.0000 mg | DELAYED_RELEASE_TABLET | Freq: Every day | ORAL | 3 refills | Status: DC

## 2017-05-30 NOTE — Patient Instructions (Signed)
EGD. The risks of bleeding, perforation and infection were reviewed with patient.  

## 2017-05-30 NOTE — Progress Notes (Signed)
   Subjective:    Patient ID: Steven Rivas, male    DOB: [birth year], 47 y.o.   MRN: [id number]  HPI Referred by Dr. Donzetta Sprungerry Rivas for epigastric pain. The end of November, he was at the ED with his grandmother. He says afterwards, he had nausea for about a week. Symptoms resolved. But now he has some slight epigastric pain.  He also states the pain moves around his abdomen. Epigastric pain since December.   He also has some flatus. Denies any burping. He does have acid reflux frequently. Takes Prilosec as needed. Saw Dr. Reuel Rivas and was given an Rx for Dicyclomine which he cannot tell any difference. No NSAIDS except for the Motrin Takes Motrin once a week. Has stopped eating red sauces.  Appetite is good. No weight loss. Has a BM daily. No melena or BRRB  Grandmother had colon cancer at age 48 (deceased)  04/25/2017 H and H 15.8 and 45.6. H. Pylori negative  Review of Systems Past Medical History:  Diagnosis Date  . Anxiety   . GERD (gastroesophageal reflux disease)   . Heart palpitations     Past Surgical History:  Procedure Laterality Date  . ANAL FISTULOTOMY N/A 10/01/2016   Procedure: ANAL FISTULOTOMY;  Surgeon: Steven MachoJenkins, Mark, MD;  Location: [facility];  Service: General;  Laterality: N/A;  . HEMORRHOID SURGERY  10/01/2016   Procedure: HEMORRHOIDECTOMY;  Surgeon: Steven MachoJenkins, Mark, MD;  Location: [facility];  Service: General;;    Allergies  Allergen Reactions  . Bee Venom Shortness Of Breath and Swelling  . Corticosteroids Other (See Comments)    STEROID SHOTS--Caused heart palpitation  . Other Other (See Comments)    ANTIHISTAMINE CAUSE HEART PALPITATIONS  . Caffeine Palpitations    HEART PALPITATIONS    Current Outpatient Medications on File Prior to Visit  Medication Sig Dispense Refill  . dicyclomine (BENTYL) 20 MG tablet Take 20 mg by mouth 3 (three) times daily before meals.    Marland Kitchen. EPINEPHrine 0.3 mg/0.3 mL IJ SOAJ injection Inject 0.3 mg into the muscle once.    Marland Kitchen.  ibuprofen (ADVIL,MOTRIN) 200 MG tablet Take 400-600 mg by mouth every 6 (six) hours as needed for headache (depends on pain if takes 2-3 tablets).    . LORazepam (ATIVAN) 0.5 MG tablet Take 0.5 mg by mouth 2 (two) times daily.  0  . metoprolol tartrate (LOPRESSOR) 50 MG tablet Take 50 mg by mouth 2 (two) times daily.  3  . omeprazole (PRILOSEC) 20 MG capsule Take 20 mg by mouth daily as needed (indigestion).   1  . PARoxetine (PAXIL) 20 MG tablet Take 20 mg by mouth daily.  1   No current facility-administered medications on file prior to visit.         Objective:   Physical Exam Blood pressure 138/80, pulse 72, temperature 98.7 F (37.1 C), height 6' (1.829 m), weight 210 lb 11.2 oz (95.6 kg). Alert and oriented. Skin warm and dry. Oral mucosa is moist.   . Sclera anicteric, conjunctivae is pink. Thyroid not enlarged. No cervical lymphadenopathy. Lungs clear. Heart regular rate and rhythm.  Abdomen is soft. Bowel sounds are positive. No hepatomegaly. No abdominal masses felt. Epigastric  tenderness.  No edema to lower extremities.          Assessment & Plan:  GERD. Needs EGD. Am also going to get an US abdomen.  Rx for Protonix 40mg  po sent to his pharmacy.

## 2017-06-07 ENCOUNTER — Ambulatory Visit (HOSPITAL_COMMUNITY)
Admission: RE | Admit: 2017-06-07 | Discharge: 2017-06-07 | Disposition: A | Discharge status: Home / Self Care | Payer: Commercial Managed Care - PPO | Source: Ambulatory Visit | Attending: Internal Medicine | Admitting: Internal Medicine

## 2017-06-07 DIAGNOSIS — N281 Cyst of kidney, acquired: Secondary | ICD-10-CM | POA: Insufficient documentation

## 2017-06-07 DIAGNOSIS — R1013 Epigastric pain: Secondary | ICD-10-CM | POA: Diagnosis present

## 2017-06-12 ENCOUNTER — Ambulatory Visit (HOSPITAL_COMMUNITY)
Admission: RE | Admit: 2017-06-12 | Discharge: 2017-06-12 | Disposition: A | Discharge status: Home / Self Care | Payer: Commercial Managed Care - PPO | Source: Ambulatory Visit | Attending: Internal Medicine | Admitting: Internal Medicine

## 2017-06-12 ENCOUNTER — Encounter (HOSPITAL_COMMUNITY): Payer: Self-pay | Admitting: *Deleted

## 2017-06-12 ENCOUNTER — Encounter (HOSPITAL_COMMUNITY)
Admission: RE | Disposition: A | Discharge status: Home / Self Care | Payer: Self-pay | Source: Ambulatory Visit | Attending: Internal Medicine

## 2017-06-12 ENCOUNTER — Other Ambulatory Visit: Payer: Self-pay

## 2017-06-12 DIAGNOSIS — R1013 Epigastric pain: Secondary | ICD-10-CM | POA: Diagnosis present

## 2017-06-12 DIAGNOSIS — K269 Duodenal ulcer, unspecified as acute or chronic, without hemorrhage or perforation: Secondary | ICD-10-CM | POA: Diagnosis not present

## 2017-06-12 DIAGNOSIS — K3189 Other diseases of stomach and duodenum: Secondary | ICD-10-CM | POA: Insufficient documentation

## 2017-06-12 DIAGNOSIS — K219 Gastro-esophageal reflux disease without esophagitis: Secondary | ICD-10-CM | POA: Insufficient documentation

## 2017-06-12 DIAGNOSIS — F419 Anxiety disorder, unspecified: Secondary | ICD-10-CM | POA: Diagnosis not present

## 2017-06-12 DIAGNOSIS — Z79899 Other long term (current) drug therapy: Secondary | ICD-10-CM | POA: Diagnosis not present

## 2017-06-12 DIAGNOSIS — Z888 Allergy status to other drugs, medicaments and biological substances status: Secondary | ICD-10-CM | POA: Diagnosis not present

## 2017-06-12 DIAGNOSIS — Z91030 Bee allergy status: Secondary | ICD-10-CM | POA: Insufficient documentation

## 2017-06-12 HISTORY — PX: BIOPSY: SHX5522

## 2017-06-12 HISTORY — PX: ESOPHAGOGASTRODUODENOSCOPY: SHX5428

## 2017-06-12 SURGERY — EGD (ESOPHAGOGASTRODUODENOSCOPY)
Anesthesia: Moderate Sedation

## 2017-06-12 MED ORDER — MEPERIDINE HCL 50 MG/ML IJ SOLN
INTRAMUSCULAR | Status: DC | PRN
  Administered 2017-06-12 (×4): 25 mg via INTRAVENOUS

## 2017-06-12 MED ORDER — STERILE WATER FOR IRRIGATION IR SOLN
Status: DC | PRN
  Administered 2017-06-12: 11:00:00

## 2017-06-12 MED ORDER — SODIUM CHLORIDE 0.9 % IV SOLN
INTRAVENOUS | Status: DC
  Administered 2017-06-12: 1000 mL via INTRAVENOUS

## 2017-06-12 MED ORDER — MIDAZOLAM HCL 5 MG/5ML IJ SOLN
INTRAMUSCULAR | Status: AC
  Filled 2017-06-12: qty 10

## 2017-06-12 MED ORDER — MEPERIDINE HCL 50 MG/ML IJ SOLN
INTRAMUSCULAR | Status: AC
  Filled 2017-06-12: qty 1

## 2017-06-12 MED ORDER — MIDAZOLAM HCL 5 MG/5ML IJ SOLN
INTRAMUSCULAR | Status: DC | PRN
  Administered 2017-06-12: 2 mg via INTRAVENOUS
  Administered 2017-06-12 (×2): 3 mg via INTRAVENOUS
  Administered 2017-06-12: 2 mg via INTRAVENOUS

## 2017-06-12 MED ORDER — LIDOCAINE VISCOUS 2 % MT SOLN
OROMUCOSAL | Status: AC
  Filled 2017-06-12: qty 15

## 2017-06-12 MED ORDER — LIDOCAINE VISCOUS 2 % MT SOLN
OROMUCOSAL | Status: DC | PRN
  Administered 2017-06-12: 4 mL via OROMUCOSAL

## 2017-06-12 NOTE — Discharge Instructions (Signed)
Resume usual medications and diet as before. No driving for 24 hours. Physician will call with biopsy results and further recommendations.     Esophagogastroduodenoscopy, Care After Refer to this sheet in the next few weeks. These instructions provide you with information about caring for yourself after your procedure. Your health care provider may also give you more specific instructions. Your treatment has been planned according to current medical practices, but problems sometimes occur. Call your health care provider if you have any problems or questions after your procedure. What can I expect after the procedure? After the procedure, it is common to have:  A sore throat.  Nausea.  Bloating.  Dizziness.  Fatigue.  Follow these instructions at home:  Do not eat or drink anything until the numbing medicine (local anesthetic) has worn off and your gag reflex has returned. You will know that the local anesthetic has worn off when you can swallow comfortably.  Do not drive for 24 hours if you received a medicine to help you relax (sedative).  If your health care provider took a tissue sample for testing during the procedure, make sure to get your test results. This is your responsibility. Ask your health care provider or the department performing the test when your results will be ready.  Keep all follow-up visits as told by your health care provider. This is important. Contact a health care provider if:  You cannot stop coughing.  You are not urinating.  You are urinating less than usual. Get help right away if:  You have trouble swallowing.  You cannot eat or drink.  You have throat or chest pain that gets worse.  You are dizzy or light-headed.  You faint.  You have nausea or vomiting.  You have chills.  You have a fever.  You have severe abdominal pain.  You have black, tarry, or bloody stools. This information is not intended to replace advice given to you  by your health care provider. Make sure you discuss any questions you have with your health care provider. Document Released: 04/16/2012 Document Revised: 10/06/2015 Document Reviewed: 03/24/2015 Elsevier Interactive Patient Education  [employer]2018 [employer] Inc.

## 2017-06-12 NOTE — H&P (Signed)
Steven Rivas is an 47 y.o. male.   Chief Complaint: Patient is here for EGD. HPI: Patient is 47 year old Caucasian male who was present illness started about 8 weeks ago when he noted nausea.  He felt he had a GI bug.  Nausea has resolved but he is been having epigastric pain.  He was treated with dicyclomine but did not feel any better.  He has been on pantoprazole for about 13 days.  He had upper abdominal ultrasound was negative for cholelithiasis.  He states yesterday he did not have any pain but he does this morning.  He denies melena.  He does not smoke cigarettes or drink alcohol.  He states he has been under a lot of stress because of his job and also his grandmother died of carcinoma.  He takes Advil for headache occasionally.  Past Medical History:  Diagnosis Date  . Abdominal pain, epigastric 05/30/2017  . Anxiety   . GERD (gastroesophageal reflux disease)   . GERD (gastroesophageal reflux disease) 05/30/2017  . Heart palpitations     Past Surgical History:  Procedure Laterality Date  . ANAL FISTULOTOMY N/A 10/01/2016   Procedure: ANAL FISTULOTOMY;  Surgeon: Franky Macho, MD;  Location: [facility];  Service: General;  Laterality: N/A;  . HEMORRHOID SURGERY  10/01/2016   Procedure: HEMORRHOIDECTOMY;  Surgeon: Franky Macho, MD;  Location: [facility];  Service: General;;    Family History  Problem Relation Age of Onset  . Leukemia Father   . Stroke Father    Social History:  reports that  has never smoked. he has never used smokeless tobacco. He reports that he does not drink alcohol or use drugs.  Allergies:  Allergies  Allergen Reactions  . Bee Venom Shortness Of Breath and Swelling  . Corticosteroids Other (See Comments)    STEROID SHOTS--Caused heart palpitation  . Other Other (See Comments)    ANTIHISTAMINE CAUSE HEART PALPITATIONS  . Caffeine Palpitations    HEART PALPITATIONS    Medications Prior to Admission  Medication Sig Dispense Refill  . ibuprofen (ADVIL,MOTRIN)  200 MG tablet Take 400-600 mg by mouth every 6 (six) hours as needed for headache (depends on pain if takes 2-3 tablets).    . LORazepam (ATIVAN) 0.5 MG tablet Take 0.5 mg by mouth 2 (two) times daily.  0  . metoprolol tartrate (LOPRESSOR) 50 MG tablet Take 50 mg by mouth 2 (two) times daily.  3  . pantoprazole (PROTONIX) 40 MG tablet Take 1 tablet (40 mg total) by mouth daily. 30 tablet 3  . PARoxetine (PAXIL) 20 MG tablet Take 20 mg by mouth daily.  1  . EPINEPHrine 0.3 mg/0.3 mL IJ SOAJ injection Inject 0.3 mg into the muscle once.      No results found for this or any previous visit (from the past 48 hour(s)). No results found.  ROS  Blood pressure 133/89, pulse 67, temperature 97.9 F (36.6 C), temperature source Oral, resp. rate 16, height 6' (1.829 m), weight 198 lb (89.8 kg), SpO2 100 %. Physical Exam  Constitutional: He appears well-developed and well-nourished.  HENT:  Mouth/Throat: Oropharynx is clear and moist.  Eyes: Conjunctivae are normal. No scleral icterus.  Neck: No thyromegaly present.  Cardiovascular: Normal rate, regular rhythm and normal heart sounds.  No murmur heard. Respiratory: Effort normal and breath sounds normal.  GI: Soft. He exhibits no distension and no mass. There is no tenderness.  Musculoskeletal: He exhibits no edema.  Lymphadenopathy:    He has no  cervical adenopathy.  Neurological: He is alert.  Skin: Skin is warm and dry.     Assessment/Plan Epigastric pain unresponsive therapy. Diagnostic EGD.  Lionel DecemberNajeeb Rehman, MD 06/12/2017, 11:22 AM

## 2017-06-12 NOTE — Op Note (Signed)
[facility] Patient Name: Steven Rivas Procedure Date: 06/12/2017 11:06 AM MRN: [id number] Date of Birth: 26-Jul-1970 Attending MD: Lionel December , MD CSN: [id number] Age: 47 Admit Type: Outpatient Procedure:                Upper GI endoscopy Indications:              Epigastric abdominal pain Providers:                Lionel December, MD, Loma Messing B. Patsy Lager, RN, Dyann Ruddle Referring MD:             Lucita Lora. Reuel Boom, MD Medicines:                Meperidine 100 mg IV, Midazolam 10 mg IV Complications:            No immediate complications. Estimated Blood Loss:     Estimated blood loss was minimal. Procedure:                Pre-Anesthesia Assessment:                           - Prior to the procedure, a History and Physical                            was performed, and patient medications and                            allergies were reviewed. The patient's tolerance of                            previous anesthesia was also reviewed. The risks                            and benefits of the procedure and the sedation                            options and risks were discussed with the patient.                            All questions were answered, and informed consent                            was obtained. Prior Anticoagulants: The patient has                            taken no previous anticoagulant or antiplatelet                            agents. ASA Grade Assessment: I - A normal, healthy                            patient. After reviewing the risks and benefits,  the patient was deemed in satisfactory condition to                            undergo the procedure.                           After obtaining informed consent, the endoscope was                            passed under direct vision. Throughout the                            procedure, the patient's blood pressure, pulse, and                            oxygen  saturations were monitored continuously. The                            [id number] ([id number] scope was introduced through the                            mouth, and advanced to the second part of duodenum.                            The upper GI endoscopy was accomplished without                            difficulty. The patient tolerated the procedure                            well. Scope In: 11:38:16 AM Scope Out: 11:47:16 AM Total Procedure Duration: 0 hours 9 minutes 0 seconds  Findings:      The examined esophagus was normal.      The Z-line was regular and was found 40 cm from the incisors.      Three erosions were found in the gastric antrum. Biopsies were taken       with a cold forceps for histology. Biopsies were taken with a cold       forceps for histology. The pathology specimen was placed into Bottle       Number 1.      The exam of the stomach was otherwise normal.      A few erosions without bleeding were found in the duodenal bulb.      The second portion of the duodenum was normal. Impression:               - Normal esophagus.                           - Z-line regular, 40 cm from the incisors.                           - Erosive gastropathy. Biopsied.                           - Duodenal erosions without bleeding.                           -  Normal second portion of the duodenum. Moderate Sedation:      Moderate (conscious) sedation was administered by the endoscopy nurse       and supervised by the endoscopist. The following parameters were       monitored: oxygen saturation, heart rate, blood pressure, CO2       capnography and response to care. Total physician intraservice time was       20 minutes. Recommendation:           - Patient has a contact number available for                            emergencies. The signs and symptoms of potential                            delayed complications were discussed with the                            patient. Return to  normal activities tomorrow.                            Written discharge instructions were provided to the                            patient.                           - Resume previous diet today.                           - Continue present medications.                           - Await pathology results. Procedure Code(s):        --- Professional ---                           [phone], Esophagogastroduodenoscopy, flexible,                            transoral; with biopsy, single or multiple                           [id number], Moderate sedation services provided by the                            same physician or other qualified health care                            professional performing the diagnostic or                            therapeutic service that the sedation supports,                            requiring the presence of an independent trained  observer to assist in the monitoring of the                            patient's level of consciousness and physiological                            status; initial 15 minutes of intraservice time,                            patient age 62 years or older Diagnosis Code(s):        --- Professional ---                           K31.89, Other diseases of stomach and duodenum                           K26.9, Duodenal ulcer, unspecified as acute or                            chronic, without hemorrhage or perforation                           R10.13, Epigastric pain CPT copyright 2016 American Medical Association. All rights reserved. The codes documented in this report are preliminary and upon coder review may  be revised to meet current compliance requirements. Lionel December, MD Lionel December, MD 06/12/2017 11:55:02 AM This report has been signed electronically. Number of Addenda: 0

## 2017-06-14 ENCOUNTER — Encounter (HOSPITAL_COMMUNITY): Payer: Self-pay | Admitting: Internal Medicine

## 2018-07-08 IMAGING — US US ABDOMEN COMPLETE
1 series · 14 of 25 positions shown · non-contrast
Comparison: None.

CLINICAL DATA: Epigastric pain for 1 month

EXAM:
ABDOMEN ULTRASOUND COMPLETE

[Series 1: us abdomen complete · 0.21mm/px · 14 of 95 slices shown]
[im 1/95]
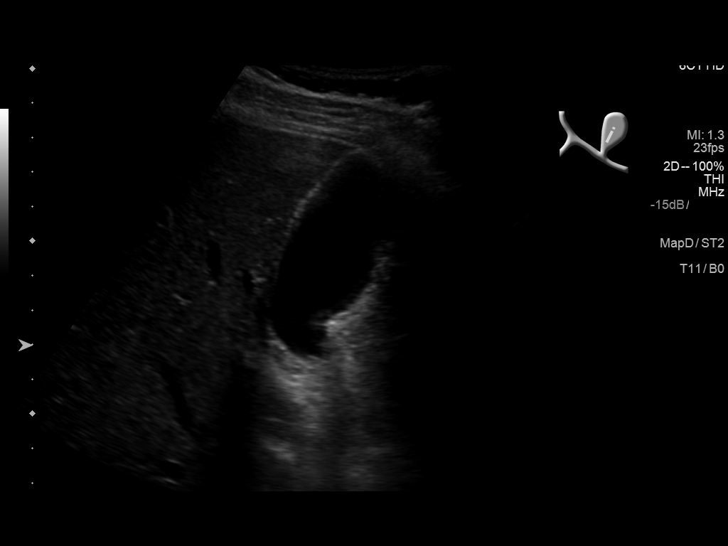
[im 8/95]
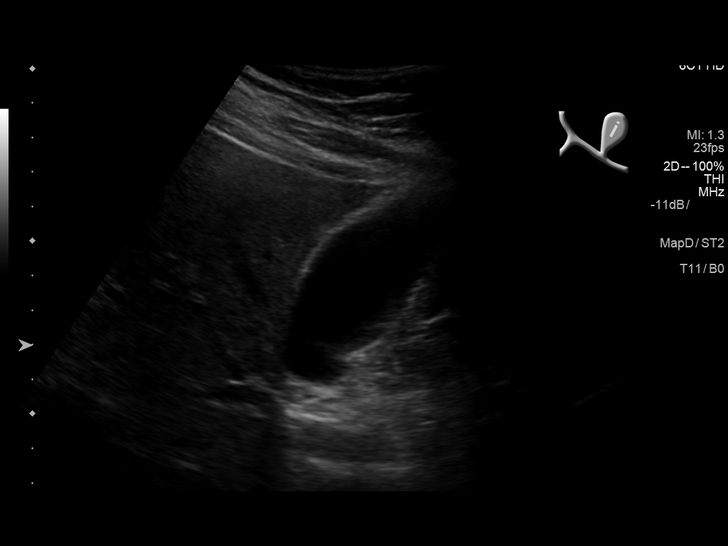
[im 16/95]
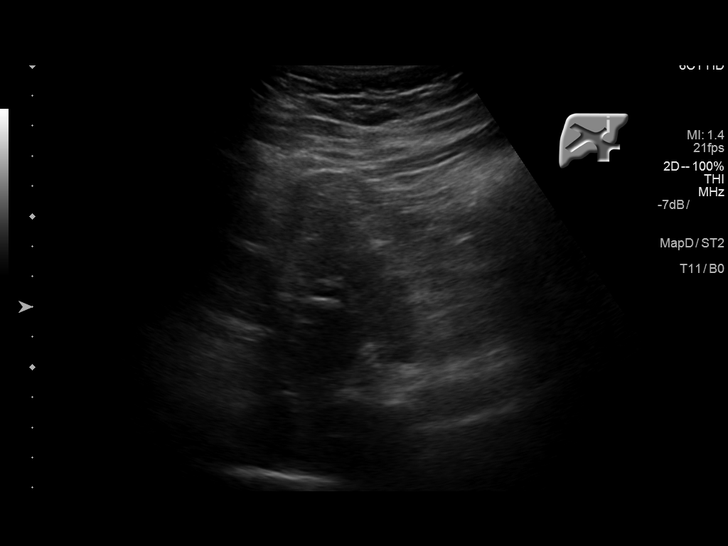
[im 24/95]
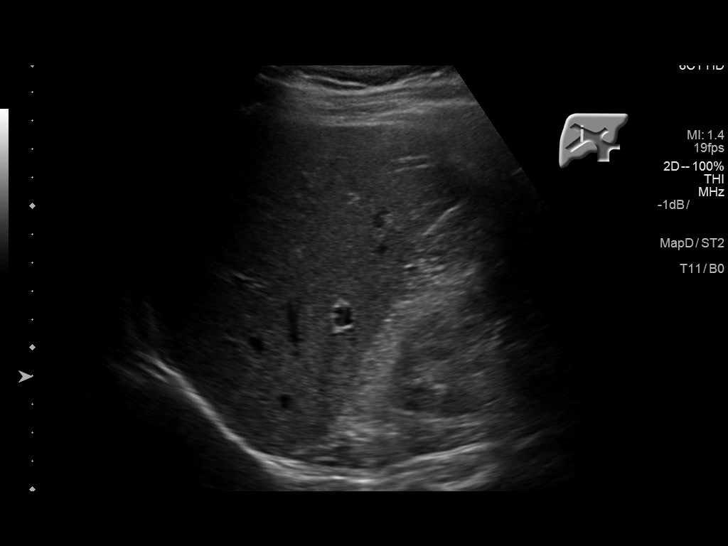
[im 32/95]
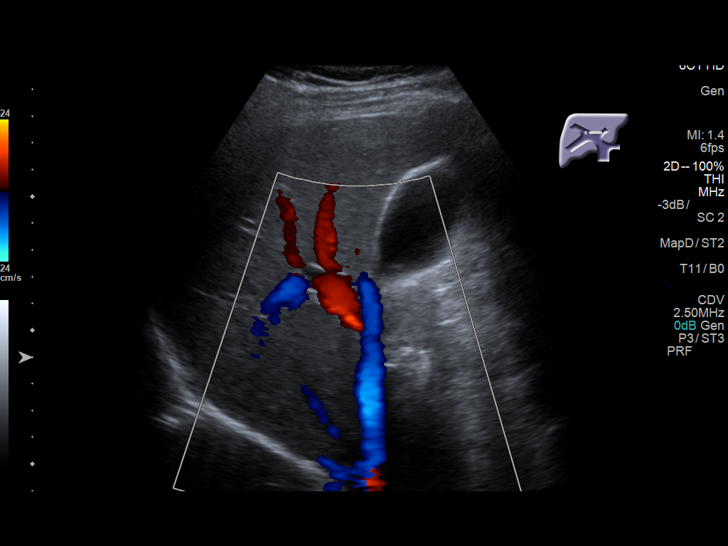
[im 36/95]
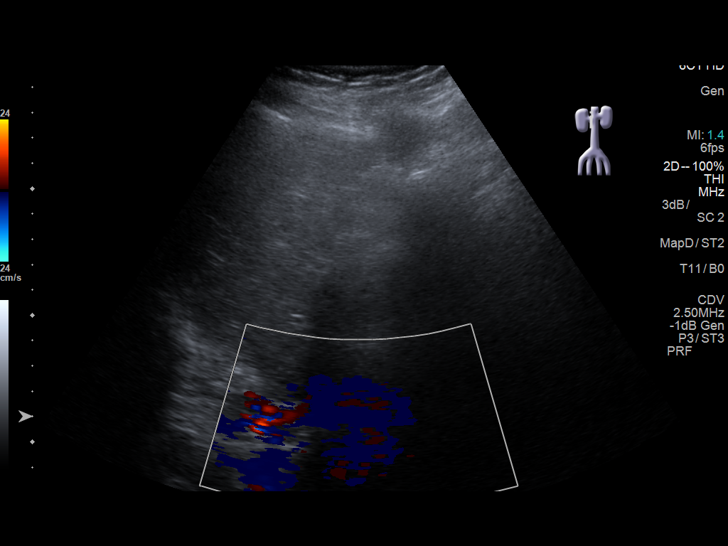
[im 44/95]
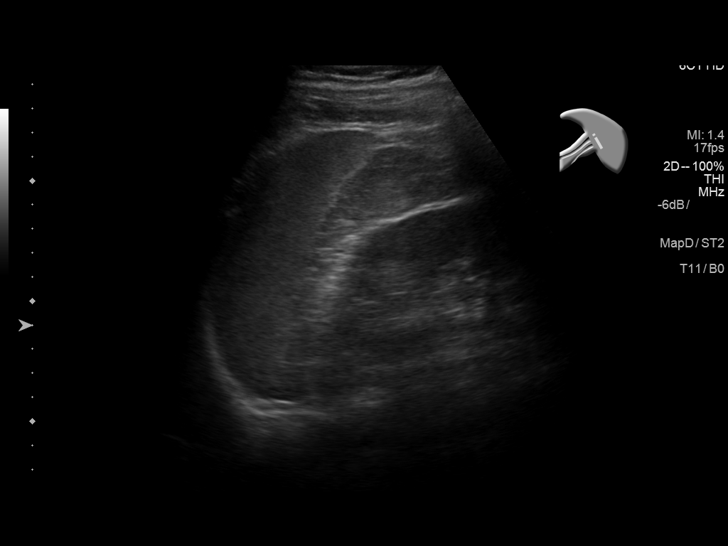
[im 51/95]
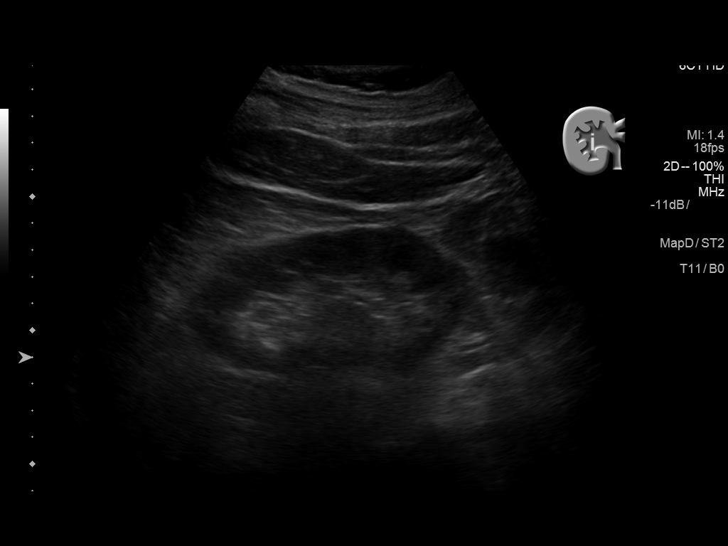
[im 59/95]
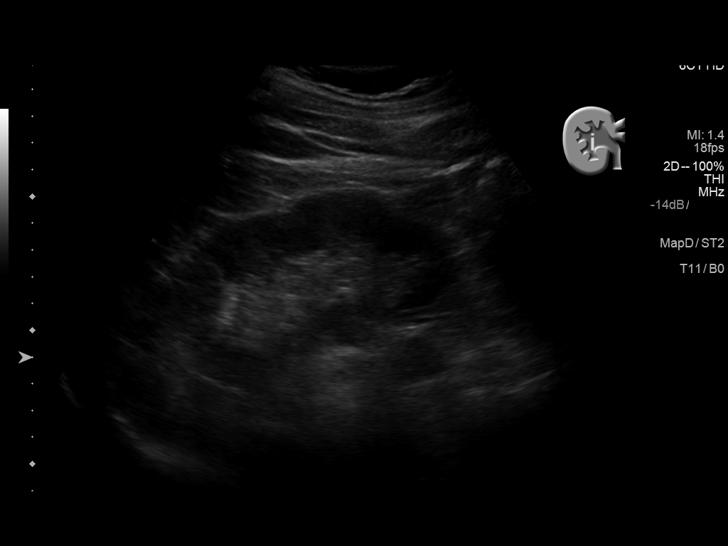
[im 63/95]
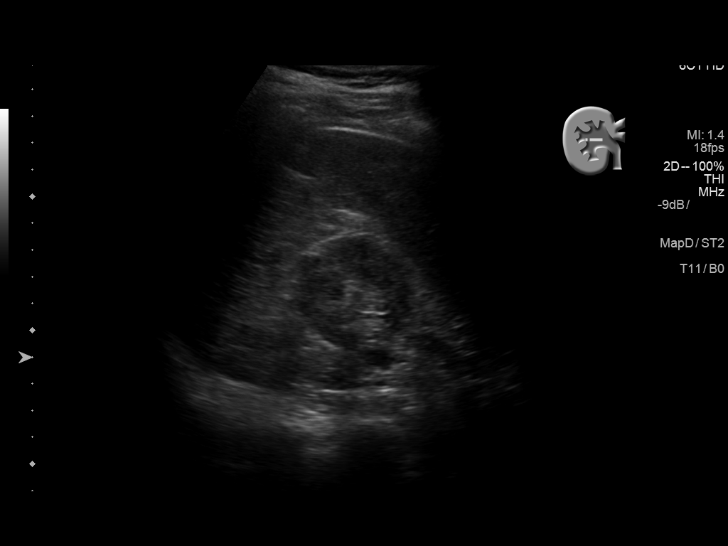
[im 71/95]
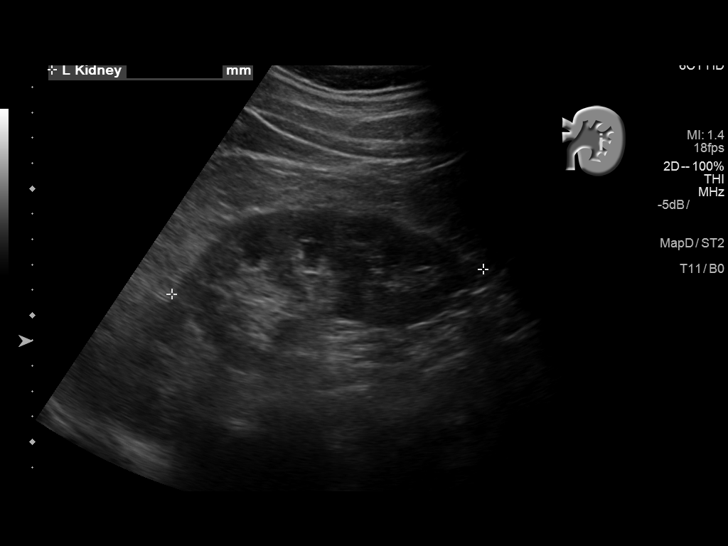
[im 79/95]
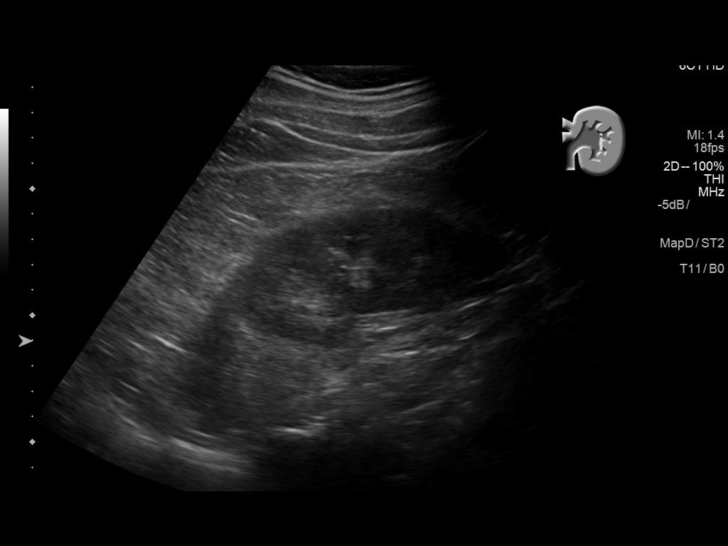
[im 87/95]
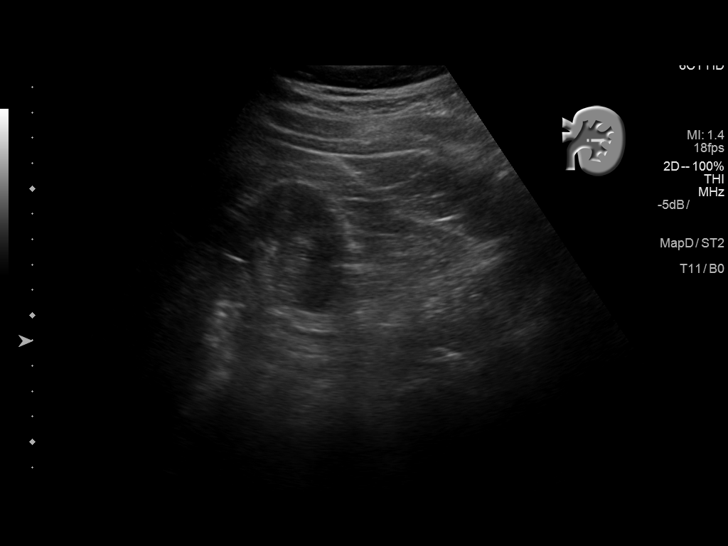
[im 95/95]
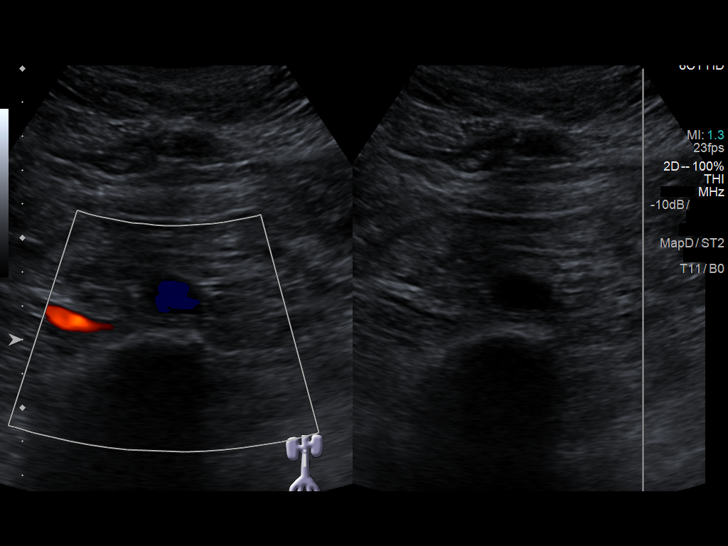

[14 of 25 positions shown; findings below may reference images not displayed]

FINDINGS: Gallbladder: No gallstones or wall thickening visualized. No
sonographic Murphy sign noted by sonographer.

Common bile duct: Diameter: 3 mm

Liver: No focal lesion identified. Within normal limits in
parenchymal echogenicity. Portal vein is patent on color Doppler
imaging with normal direction of blood flow towards the liver.

IVC: No abnormality visualized.

Pancreas: Visualized portion unremarkable.

Spleen: Size and appearance within normal limits.

Right Kidney: Length: 11.3 cm.. 2 cm cyst is noted in the medial
aspect of the right kidney simple in nature.

Left Kidney: Length: 12.4 cm.. Echogenicity within normal limits. No
mass or hydronephrosis visualized.

Abdominal aorta: No aneurysm visualized.

Other findings: None.
IMPRESSION: Right renal cyst.  No acute abnormality noted.

## 2018-11-28 ENCOUNTER — Other Ambulatory Visit: Payer: Self-pay | Admitting: Internal Medicine

## 2018-11-28 ENCOUNTER — Other Ambulatory Visit: Payer: Commercial Managed Care - PPO

## 2018-11-28 DIAGNOSIS — Z20822 Contact with and (suspected) exposure to covid-19: Secondary | ICD-10-CM

## 2018-12-03 LAB — NOVEL CORONAVIRUS, NAA: SARS-CoV-2, NAA: NOT DETECTED

## 2019-02-06 ENCOUNTER — Other Ambulatory Visit: Payer: Self-pay

## 2019-02-06 DIAGNOSIS — Z20822 Contact with and (suspected) exposure to covid-19: Secondary | ICD-10-CM

## 2019-02-07 LAB — NOVEL CORONAVIRUS, NAA: SARS-CoV-2, NAA: NOT DETECTED

## 2022-03-24 ENCOUNTER — Encounter (INDEPENDENT_AMBULATORY_CARE_PROVIDER_SITE_OTHER): Payer: Self-pay | Admitting: Gastroenterology

## 2022-04-12 ENCOUNTER — Ambulatory Visit (INDEPENDENT_AMBULATORY_CARE_PROVIDER_SITE_OTHER): Payer: Commercial Managed Care - PPO

## 2022-04-12 ENCOUNTER — Ambulatory Visit (INDEPENDENT_AMBULATORY_CARE_PROVIDER_SITE_OTHER): Payer: Commercial Managed Care - PPO | Admitting: Orthopedic Surgery

## 2022-04-12 ENCOUNTER — Encounter: Payer: Self-pay | Admitting: Orthopedic Surgery

## 2022-04-12 VITALS — BP 145/88 | HR 62 | Ht 72.0 in | Wt 205.0 lb

## 2022-04-12 DIAGNOSIS — M25562 Pain in left knee: Secondary | ICD-10-CM

## 2022-04-12 DIAGNOSIS — M23322 Other meniscus derangements, posterior horn of medial meniscus, left knee: Secondary | ICD-10-CM | POA: Diagnosis not present

## 2022-04-12 NOTE — Progress Notes (Signed)
Chief Complaint  Patient presents with   Knee Pain    Left Knee pain for 2 wks. States he was standing in a high chair and forgot how high it was when he stepped down and thinks his knee may have buckled a little. Has a pinpoint area of pain and says it has the same type of pain as the tendinitis in his elbow.     HPI: As noted above 51 year old male  Therapy slipped off a chair and he twisted something felt weird and then since that time he said pain on the medial side of his knee joint occasional click and pop increased pain and swelling after activity  He is taking ibuprofen for the last 2-1/2 weeks  Note corticosteroid injection caused heart palpitations as did antihistamine and caffeine  Past Medical History:  Diagnosis Date   Abdominal pain, epigastric 05/30/2017   Anxiety    GERD (gastroesophageal reflux disease)    GERD (gastroesophageal reflux disease) 05/30/2017   Heart palpitations     BP (!) 145/88   Pulse 62   Ht 6' (1.829 m)   Wt 205 lb (93 kg)   BMI 27.80 kg/m    General appearance: Well-developed well-nourished no gross deformities  Cardiovascular normal pulse and perfusion normal color without edema  Neurologically no sensation loss or deficits or pathologic reflexes  Psychological: Awake alert and oriented x3 mood and affect normal  Skin no lacerations or ulcerations no nodularity no palpable masses, no erythema or nodularity  Musculoskeletal:  Gait normal no limp no assistive devices   Medial joint line is tender McMurray's sign is positive pain is located over the medial joint line slight effusion  Slight decrease in extension  Flexion seen is normal  Ligament seem normal  Imaging slight narrowing medial compartment grade 1 OA  A/P  Torn medial meniscus  Recommend CT arthrogram secondary to metal being in the patient's chest near his lung under his left clavicle  Follow-up after MRI result

## 2022-04-12 NOTE — Patient Instructions (Signed)
While we are working on your approval for MRI please go ahead and call to schedule your appointment with [facility] within at least one (1) week.   Central Scheduling [phone]  

## 2022-05-08 ENCOUNTER — Other Ambulatory Visit (HOSPITAL_COMMUNITY): Payer: Commercial Managed Care - PPO

## 2022-05-08 ENCOUNTER — Ambulatory Visit (HOSPITAL_COMMUNITY): Admission: RE | Admit: 2022-05-08 | Payer: Commercial Managed Care - PPO | Source: Ambulatory Visit

## 2022-07-12 ENCOUNTER — Encounter: Payer: Self-pay | Admitting: Radiology

## 2023-08-09 NOTE — Progress Notes (Signed)
 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  Hawkins eval pre EPIC was told "didn't look that good"  but says did not need to see him back.    History of Present Illness  08/12/2023  Pulmonary/ 1st office eval/ Remonia Otte /  Office  Chief Complaint  Patient presents with   Establish Care   Shortness of Breath  Dyspnea:  can still moderate paced jog -  hills only   problem  Cough: none  Sleep: flat bed / one pillow s resp cc  SABA use: none  02:none  Occ HB on ppi but not consistently and not AC  No obvious day to day or daytime pattern/variability or assoc excess/ purulent sputum or mucus plugs or hemoptysis or cp or chest tightness, subjective wheeze or overt sinus  symptoms.    Also denies any obvious fluctuation of symptoms with weather or environmental changes or other aggravating or alleviating factors except as outlined above   No unusual exposure hx or h/o childhood pna/ asthma or knowledge of premature birth.  Current Allergies, Complete Past Medical History, Past Surgical History, Family History, and Social History were reviewed in [employer] record.  ROS  The following are not active complaints unless bolded Hoarseness, sore throat, dysphagia, dental problems, itching, sneezing,  nasal congestion or discharge of excess mucus or purulent secretions, ear ache,   fever, chills, sweats, unintended wt loss or wt gain, classically pleuritic or exertional cp,  orthopnea pnd or arm/hand swelling  or leg swelling, presyncope, palpitations, abdominal pain, anorexia, nausea, vomiting, diarrhea  or change in bowel habits or change in bladder habits, change in stools or change in urine, dysuria, hematuria,  rash, arthralgias, visual complaints, headache, numbness, weakness or ataxia or problems with walking or  coordination,  change in mood ( anxious) or  memory.            Outpatient Medications Prior to Visit  Medication Sig Dispense Refill   EPINEPHrine 0.3 mg/0.3 mL IJ SOAJ injection Inject 0.3 mg into the muscle once.     ibuprofen (ADVIL,MOTRIN) 200 MG tablet Take 400-600 mg by mouth every 6 (six) hours as needed for headache (depends on pain if takes 2-3 tablets).     LORazepam (ATIVAN) 1 MG tablet Take 1 mg by mouth 2 (two) times daily.     metoprolol tartrate (LOPRESSOR) 50 MG tablet Take 50 mg by mouth 2 (two) times daily.  3   omeprazole (PRILOSEC) 20 MG capsule Take 20 mg by mouth daily.     PARoxetine (PAXIL) 10 MG tablet Take 10 mg by mouth daily.     PARoxetine (PAXIL) 20 MG tablet Take 20 mg by mouth daily. TAKES 27.5MG   1   terbinafine (LAMISIL) 250 MG tablet Take 250 mg by mouth daily.     tadalafil (CIALIS) 20 MG tablet Take 10 mg by mouth daily as needed. (Patient not taking: Reported on 08/12/2023)     LORazepam (ATIVAN) 0.5 MG tablet Take 0.5 mg by mouth 2 (two) times daily.  0   pantoprazole (PROTONIX) 40 MG tablet Take 1 tablet (40 mg total) by mouth daily. (Patient not taking: Reported on 04/12/2022) 30 tablet 3   No facility-administered medications prior to visit.    Past Medical History:  Diagnosis Date  Abdominal pain, epigastric 05/30/2017   Anxiety    GERD (gastroesophageal reflux disease)    GERD (gastroesophageal reflux disease) 05/30/2017   Heart palpitations       Objective:     BP 125/75   Pulse 63   Ht 6' (1.829 m)   Wt 209 lb (94.8 kg)   SpO2 97%   BMI 28.35 kg/m   SpO2: 97 % RA   Pleasant somewhat tense healthy appearing amb wm nad    HEENT : Oropharynx  nl      Nasal turbinates nl    NECK :  without  apparent JVD/ palpable Nodes/TM    LUNGS: no acc muscle use,  Nl contour chest which is clear to A and P bilaterally without cough on insp or exp maneuvers   CV:  RRR  no s3 or murmur or increase in P2, and no edema   ABD:  soft  and nontender   MS:  Gait nl   ext warm without deformities Or obvious joint restrictions  calf tenderness, cyanosis or clubbing    SKIN: warm and dry without lesions    NEURO:  alert, approp, nl sensorium with  no motor or cerebellar deficits apparent.    I personally reviewed images and agree with radiology impression as follows:  Chest CT s contrast   March 2021 1. Two separate 3 mm LEFT LOWER lobe nodules, unchanged since  05/30/2010 CT (which   available to compare) and considered benign. No further imaging follow-up recommended. 2. Coronary artery disease.       Assessment   DOE (dyspnea on exertion) Onset ? In 30's >  Hawkins eval not fruitful  - 08/12/2023 reports more sob at rest than with exertion  - 08/12/2023   Walked on RA  x  3  lap(s) =  approx 450  ft  @ very fast pace, stopped due to end of study s sob  with lowest 02 sats 96%    Symptoms of needing to take an extra breath are typical of mild hyperventilation from anxiety as they don't occur with ex or sleeping.  Reviewed nl resp physiology with concept of FRC as the relaxation volume and trying to breathe above that volume results in higher elastic recoil pressures as this is likely what he's experiencing.    As  he also has some gerd symptoms this could also be in part sob related to "reflex to reflux (LPR) and should max rx for this for 1 month to see if any difference but I don't see a pulmonary problem here so f/u can be prn      Multiple pulmonary nodules determined by computed tomography of lung Never smoker -  See CT s contast march 2021 with largest 3 mm and no change since 2012   CT results reviewed with pt >>> Too small for PET or bx, not suspicious enough for excisional bx > really only option for now is follow the Fleischner society guidelines as rec by radiology >> no directed f/u indicated at this point or any in the foreseeable future  Discussed in detail all the  indications, usual  risks and  alternatives  relative to the benefits with patient who agrees to proceed with conservative f/u with cxr today and prn new symptoms.   Each maintenance medication was reviewed in detail including emphasizing most importantly the difference between maintenance and prns and under what circumstances the prns are to be triggered using an action plan format where  appropriate.  Total time for H and P, chart review, counseling,   and generating customized AVS unique to this office visit / same day charting = 48 min  with   refractory respiratory  symptoms of uncertain etiology           Sandrea Hughs, MD 08/12/2023

## 2023-08-12 ENCOUNTER — Ambulatory Visit: Payer: Commercial Managed Care - PPO | Admitting: Internal Medicine

## 2023-08-12 ENCOUNTER — Encounter: Payer: Self-pay | Admitting: Internal Medicine

## 2023-08-12 ENCOUNTER — Ambulatory Visit (HOSPITAL_COMMUNITY)
Admission: RE | Admit: 2023-08-12 | Discharge: 2023-08-12 | Disposition: A | Discharge status: Home / Self Care | Source: Ambulatory Visit | Attending: Internal Medicine | Admitting: Internal Medicine

## 2023-08-12 VITALS — BP 125/75 | HR 63 | Ht 72.0 in | Wt 209.0 lb

## 2023-08-12 DIAGNOSIS — R0609 Other forms of dyspnea: Secondary | ICD-10-CM | POA: Diagnosis present

## 2023-08-12 DIAGNOSIS — R918 Other nonspecific abnormal finding of lung field: Secondary | ICD-10-CM | POA: Diagnosis not present

## 2023-08-12 NOTE — Assessment & Plan Note (Addendum)
 Onset ? In 30's >  Hawkins eval not fruitful  - 08/12/2023 reports more sob at rest than with exertion  - 08/12/2023   Walked on RA  x  3  lap(s) =  approx 450  ft  @ very fast pace, stopped due to end of study s sob  with lowest 02 sats 96%    Symptoms of needing to take an extra breath are typical of mild hyperventilation from anxiety as they don't occur with ex or sleeping.  Reviewed nl resp physiology with concept of FRC as the relaxation volume and trying to breathe above that volume results in higher elastic recoil pressures as this is likely what he's experiencing.    As  he also has some gerd symptoms this could also be in part sob related to "reflex to reflux (LPR) and should max rx for this for 1 month to see if any difference but I don't see a pulmonary problem here so f/u can be prn

## 2023-08-12 NOTE — Assessment & Plan Note (Addendum)
 Never smoker -  See CT s contast march 2021 with largest 3 mm and no change since 2012   CT results reviewed with pt >>> Too small for PET or bx, not suspicious enough for excisional bx > really only option for now is follow the Fleischner society guidelines as rec by radiology >> no directed f/u indicated at this point or any in the foreseeable future  Discussed in detail all the  indications, usual  risks and alternatives  relative to the benefits with patient who agrees to proceed with conservative f/u with cxr today and prn new symptoms.   Each maintenance medication was reviewed in detail including emphasizing most importantly the difference between maintenance and prns and under what circumstances the prns are to be triggered using an action plan format where appropriate.  Total time for H and P, chart review, counseling,   and generating customized AVS unique to this office visit / same day charting = 48 min  with   refractory respiratory  symptoms of uncertain etiology

## 2023-08-12 NOTE — Patient Instructions (Addendum)
 You have mild atherosclerosis on CT chest that needs to be factored in to decisions about when / if to re-try cholesterol lowering drugs > defer to Dr Reuel Boom  Try omeprazole  30-60 min before first meal of the day and Pepcid ac (famotidine) 20 mg one @  after supper for a month to see if helps your breathing spells      To get the most out of exercise, you need to be continuously aware that you are short of breath, but never out of breath, for at least 30 minutes daily. As you improve, it will actually be easier for you to do the same amount of exercise  in  30 minutes so always push to the level where you are short of breath.   If losing ground or developing chest discomfort with exercise let me know right away.    Please remember to go to the  x-ray department  @  [facility] for your tests - we will call you with the results when they are available.   Pulmonary follow up is as needed

## 2023-10-21 ENCOUNTER — Ambulatory Visit: Attending: Internal Medicine | Admitting: Internal Medicine

## 2023-10-21 ENCOUNTER — Telehealth: Payer: Self-pay | Admitting: Internal Medicine

## 2023-10-21 ENCOUNTER — Encounter: Payer: Self-pay | Admitting: Internal Medicine

## 2023-10-21 VITALS — BP 118/78 | HR 61 | Ht 71.0 in | Wt 196.0 lb

## 2023-10-21 DIAGNOSIS — R0602 Shortness of breath: Secondary | ICD-10-CM | POA: Diagnosis not present

## 2023-10-21 DIAGNOSIS — E785 Hyperlipidemia, unspecified: Secondary | ICD-10-CM

## 2023-10-21 DIAGNOSIS — Z136 Encounter for screening for cardiovascular disorders: Secondary | ICD-10-CM

## 2023-10-21 DIAGNOSIS — R079 Chest pain, unspecified: Secondary | ICD-10-CM

## 2023-10-21 NOTE — Progress Notes (Signed)
 Cardiology Office Note  Date: 10/21/2023   ID: Steven Rivas, DOB [birth year], MRN [id number]  PCP:  Leesa Pulling, MD  Cardiologist:  Lasalle Pointer, MD Electrophysiologist:  None   History of Present Illness: Steven Rivas is a 53 y.o. male  Referred to cardiology clinic for evaluation of chest pain and SOB.  He has been jogging daily for the last 3 months.  He reported improvement in his shortness of breath but he still has shortness of breath while jogging and unable to say if this is normal or pathological.  He also gets chest pains for 2 to 3 minutes during jogging and he usually pushes through it.  No syncope, leg swelling.  He does have palpitations and also has anxiety.  In the last few days, he has been taking cinnamon and noticed having palpitations.  He does not drink any coffee or tea.  He also reported having palpitations after eating chocolates after which he avoided eating chocolates.  Past Medical History:  Diagnosis Date   Abdominal pain, epigastric 05/30/2017   Anxiety    GERD (gastroesophageal reflux disease)    GERD (gastroesophageal reflux disease) 05/30/2017   Heart palpitations     Past Surgical History:  Procedure Laterality Date   ANAL FISTULOTOMY N/A 10/01/2016   Procedure: ANAL FISTULOTOMY;  Surgeon: Alanda Allegra, MD;  Location: [facility];  Service: General;  Laterality: N/A;   BIOPSY  06/12/2017   Procedure: BIOPSY;  Surgeon: Ruby Corporal, MD;  Location: [facility];  Service: Endoscopy;;  gastric    ESOPHAGOGASTRODUODENOSCOPY N/A 06/12/2017   Procedure: ESOPHAGOGASTRODUODENOSCOPY (EGD);  Surgeon: Ruby Corporal, MD;  Location: [facility];  Service: Endoscopy;  Laterality: N/A;  1:20pm-moved to 2/14 @ 2:00pm per Lorelee Roger   HEMORRHOID SURGERY  10/01/2016   Procedure: HEMORRHOIDECTOMY;  Surgeon: Alanda Allegra, MD;  Location: [facility];  Service: General;;    Current Outpatient Medications  Medication Sig Dispense Refill   EPINEPHrine 0.3  mg/0.3 mL IJ SOAJ injection Inject 0.3 mg into the muscle as needed.     ibuprofen  (ADVIL ,MOTRIN ) 200 MG tablet Take 400-600 mg by mouth every 6 (six) hours as needed for headache (depends on pain if takes 2-3 tablets).     LORazepam (ATIVAN) 1 MG tablet Take 1 mg by mouth 2 (two) times daily.     metoprolol tartrate (LOPRESSOR) 50 MG tablet Take 50 mg by mouth 2 (two) times daily.  3   omeprazole (PRILOSEC) 20 MG capsule Take 20 mg by mouth daily.     PARoxetine (PAXIL) 10 MG tablet Take 10 mg by mouth daily.     PARoxetine (PAXIL) 20 MG tablet Take 20 mg by mouth daily. TAKES 27.5MG   1   No current facility-administered medications for this visit.   Allergies:  Bee venom, Cefdinir, Ondansetron hcl, Penicillins, Corticosteroids, Other, and Caffeine   Social History: The patient  reports that he has never smoked. He has never used smokeless tobacco. He reports that he does not drink alcohol and does not use drugs.   Family History: The patient's family history includes Leukemia in his father; Stroke in his father.   ROS:  Please see the history of present illness. Otherwise, complete review of systems is positive for none  All other systems are reviewed and negative.   Physical Exam: VS:  BP 118/78   Pulse 61   Ht 5\' 11"  (1.803 m)   Wt 196 lb (88.9 kg)   SpO2  98%   BMI 27.34 kg/m , BMI Body mass index is 27.34 kg/m.  Wt Readings from Last 3 Encounters:  10/21/23 196 lb (88.9 kg)  08/12/23 209 lb (94.8 kg)  04/12/22 205 lb (93 kg)    General: Patient appears comfortable at rest. HEENT: Conjunctiva and lids normal, oropharynx clear with moist mucosa. Neck: Supple, no elevated JVP or carotid bruits, no thyromegaly. Lungs: Clear to auscultation, nonlabored breathing at rest. Cardiac: Regular rate and rhythm, no S3 or significant systolic murmur, no pericardial rub. Abdomen: Soft, nontender, no hepatomegaly, bowel sounds present, no guarding or rebound. Extremities: No pitting  edema, distal pulses 2+. Skin: Warm and dry. Musculoskeletal: No kyphosis. Neuropsychiatric: Alert and oriented x3, affect grossly appropriate.  Recent Labwork: No results found for requested labs within last 365 days.  No results found for: "CHOL", "TRIG", "HDL", "CHOLHDL", "VLDL", "LDLCALC", "LDLDIRECT"   Assessment and Plan:  Chest pain: Patient has chest pain lasting for 2 to 3 minutes while he jogs.  He pushes through it.  Obtain echocardiogram and exercise Myoview.  DOE: He gets short of breath at rest and reported he gets short of breath while jogging but thinks this could be normal.  Unclear if this is physiologic or pathological.  Will follow-up on exercise Myoview.  HLD: LDL more than 100, patient wants to know if he needs to be on statins.  Obtain CT calcium scoring of coronaries.  Palpitations: Reported having palpitations after he started taking cinnamon in the last few days.  Avoid cinnamon.       Medication Adjustments/Labs and Tests Ordered: Current medicines are reviewed at length with the patient today.  Concerns regarding medicines are outlined above.    Disposition:  Follow up pending results  Signed Erskin Zinda Priya Nimrit Kehres, MD, 10/21/2023 4:41 PM    [facility] [address] [place], [place], [place] [address]

## 2023-10-21 NOTE — Patient Instructions (Addendum)
 Medication Instructions:  Your physician recommends that you continue on your current medications as directed. Please refer to the Current Medication list given to you today.   Labwork: None  Testing/Procedures: Your physician has requested that you have an echocardiogram. Echocardiography is a painless test that uses sound waves to create images of your heart. It provides your doctor with information about the size and shape of your heart and how well your heart's chambers and valves are working. This procedure takes approximately one hour. There are no restrictions for this procedure. Please do NOT wear cologne, perfume, aftershave, or lotions (deodorant is allowed). Please arrive 15 minutes prior to your appointment time.  Please note: We ask at that you not bring children with you during ultrasound (echo/ vascular) testing. Due to room size and safety concerns, children are not allowed in the ultrasound rooms during exams. Our front office staff cannot provide observation of children in our lobby area while testing is being conducted. An adult accompanying a patient to their appointment will only be allowed in the ultrasound room at the discretion of the ultrasound technician under special circumstances. We apologize for any inconvenience.  Your physician has requested that you have en exercise stress myoview. For further information please visit [email or web address] Please follow instruction sheet, as given.  Non-Cardiac CT scanning, (CAT scanning), is a noninvasive, special x-ray that produces cross-sectional images of the body using x-rays and a computer. CT scans help physicians diagnose and treat medical conditions. For some CT exams, a contrast material is used to enhance visibility in the area of the body being studied. CT scans provide greater clarity and reveal more details than regular x-ray exams.   Follow-Up: Pending Results  Any Other Special Instructions Will Be Listed Below (If  Applicable). Thank you for choosing [facility]!     If you need a refill on your cardiac medications before your next appointment, please call your pharmacy.

## 2023-10-21 NOTE — Telephone Encounter (Signed)
 Checking percert on the following patient for testing scheduled at [facility].  STRESS - 10/30/2023

## 2023-10-30 ENCOUNTER — Ambulatory Visit (HOSPITAL_COMMUNITY)
Admission: RE | Admit: 2023-10-30 | Discharge: 2023-10-30 | Disposition: A | Discharge status: Home / Self Care | Source: Ambulatory Visit | Attending: Internal Medicine

## 2023-10-30 ENCOUNTER — Ambulatory Visit (HOSPITAL_COMMUNITY)
Admission: RE | Admit: 2023-10-30 | Discharge: 2023-10-30 | Disposition: A | Discharge status: Home / Self Care | Source: Ambulatory Visit | Attending: Internal Medicine | Admitting: Internal Medicine

## 2023-10-30 DIAGNOSIS — R0602 Shortness of breath: Secondary | ICD-10-CM | POA: Insufficient documentation

## 2023-10-30 DIAGNOSIS — R079 Chest pain, unspecified: Secondary | ICD-10-CM | POA: Insufficient documentation

## 2023-10-30 LAB — NM MYOCAR MULTI W/SPECT W/WALL MOTION / EF
Angina Index: 0
Duke Treadmill Score: 12
Exercise duration (min): 11 min
Exercise duration (sec): 54 s
LV dias vol: 102 mL (ref 62–150)
LV sys vol: 30 mL (ref 4.2–5.8)
Nuc Stress EF: 70 %
Peak HR: 139 {beats}/min
RATE: 0.3
Rest HR: 59 {beats}/min
Rest Nuclear Isotope Dose: 10.8 mCi
SDS: 0
SRS: 0
SSS: 0
ST Depression (mm): 0.5 mm
Stress Nuclear Isotope Dose: 33 mCi
TID: 0.87

## 2023-10-30 MED ORDER — TECHNETIUM TC 99M TETROFOSMIN IV KIT
30.0000 | PACK | Freq: Once | INTRAVENOUS | Status: AC | PRN
  Administered 2023-10-30: 33 via INTRAVENOUS

## 2023-10-30 MED ORDER — TECHNETIUM TC 99M TETROFOSMIN IV KIT
10.0000 | PACK | Freq: Once | INTRAVENOUS | Status: AC | PRN
  Administered 2023-10-30: 10.79 via INTRAVENOUS

## 2023-10-31 ENCOUNTER — Ambulatory Visit: Payer: Self-pay | Admitting: Internal Medicine

## 2023-11-05 ENCOUNTER — Telehealth: Payer: Self-pay | Admitting: Internal Medicine

## 2023-11-05 NOTE — Telephone Encounter (Signed)
 Amy - wife called stating that Mr.Frieden was concerned about his BP during his recent stress test.

## 2023-11-07 ENCOUNTER — Ambulatory Visit (HOSPITAL_COMMUNITY)
Admission: RE | Admit: 2023-11-07 | Discharge: 2023-11-07 | Disposition: A | Discharge status: Home / Self Care | Payer: Self-pay | Source: Ambulatory Visit | Attending: Internal Medicine | Admitting: Internal Medicine

## 2023-11-07 DIAGNOSIS — Z136 Encounter for screening for cardiovascular disorders: Secondary | ICD-10-CM | POA: Insufficient documentation

## 2023-11-07 DIAGNOSIS — E785 Hyperlipidemia, unspecified: Secondary | ICD-10-CM | POA: Insufficient documentation

## 2023-11-12 MED ORDER — ASPIRIN 81 MG PO CAPS: 1.0000 | ORAL_CAPSULE | Freq: Every day | ORAL | 3 refills | Status: DC

## 2023-11-12 NOTE — Telephone Encounter (Signed)
 Left message to call the office also sent patient MyChart message and copied PCP

## 2023-11-20 ENCOUNTER — Other Ambulatory Visit

## 2023-11-25 ENCOUNTER — Ambulatory Visit: Attending: Internal Medicine

## 2023-11-25 DIAGNOSIS — R079 Chest pain, unspecified: Secondary | ICD-10-CM | POA: Diagnosis not present

## 2023-11-25 DIAGNOSIS — R0602 Shortness of breath: Secondary | ICD-10-CM | POA: Diagnosis not present

## 2023-11-26 ENCOUNTER — Encounter: Payer: Self-pay | Admitting: Internal Medicine

## 2023-11-26 ENCOUNTER — Ambulatory Visit: Attending: Internal Medicine | Admitting: Internal Medicine

## 2023-11-26 VITALS — BP 104/70 | HR 57 | Ht 67.0 in | Wt 146.4 lb

## 2023-11-26 DIAGNOSIS — R931 Abnormal findings on diagnostic imaging of heart and coronary circulation: Secondary | ICD-10-CM | POA: Diagnosis not present

## 2023-11-26 LAB — ECHOCARDIOGRAM COMPLETE
AR max vel: 2.53 cm2
AV Area VTI: 2.61 cm2
AV Area mean vel: 2.59 cm2
AV Mean grad: 5 mmHg
AV Peak grad: 10.8 mmHg
Ao pk vel: 1.64 m/s
Area-P 1/2: 3.76 cm2
Calc EF: 66.7 %
MV VTI: 3.67 cm2
S' Lateral: 3.3 cm
Single Plane A2C EF: 69.7 %
Single Plane A4C EF: 66 %

## 2023-11-26 MED ORDER — ATORVASTATIN CALCIUM 10 MG PO TABS: 10.0000 mg | ORAL_TABLET | ORAL | 1 refills | Status: DC

## 2023-11-26 NOTE — Patient Instructions (Addendum)
 Medication Instructions:   Begin Atorvastatin  10mg  3 x week  Continue all other medications.     Labwork:  none  Testing/Procedures:  none  Follow-Up:  As needed    Any Other Special Instructions Will Be Listed Below (If Applicable).   If you need a refill on your cardiac medications before your next appointment, please call your pharmacy.

## 2023-11-26 NOTE — Progress Notes (Unsigned)
 Cardiology Office Note  Date: 11/26/2023   ID: Steven Rivas, DOB [birth year], MRN [id number]  PCP:  Steven Jerel MATSU, MD  Cardiologist:  Steven SHAUNNA Maywood, MD Electrophysiologist:  None   History of Present Illness: Steven Rivas is a 53 y.o. male  Referred to cardiology clinic for evaluation of chest pain and SOB.  June 2025: He has been jogging daily for the last 3 months.  He reported improvement in his shortness of breath but he still has shortness of breath while jogging and unable to say if this is normal or pathological.  He also gets chest pains for 2 to 3 minutes during jogging and he usually pushes through it.  No syncope, leg swelling.  He does have palpitations and also has anxiety.  In the last few days, he has been taking cinnamon and noticed having palpitations.  He does not drink any coffee or tea.  He also reported having palpitations after eating chocolates after which he avoided eating chocolates.  July 2025:  Echocardiogram in July 2025 showed normal LVEF, G1 DD, normal RV function, no valvular heart disease and a CVP 3 mmHg.  CT coronary calcium  score was 20, 80th percentile for age and sex matched control.  NM stress test showed findings consistent with no ischemia, low risk study.  He did have hypertensive blood pressure response.  0.5 mm ST depression noted but none more than 1 mm.  Chest pain send the SOB improved.  He continues to have palpitations and dizziness sometimes.  No syncope, leg swelling.  I reviewed and discussed echocardiogram, CT cardiac and stress test findings with the patient.  Past Medical History:  Diagnosis Date   Abdominal pain, epigastric 05/30/2017   Anxiety    GERD (gastroesophageal reflux disease)    GERD (gastroesophageal reflux disease) 05/30/2017   Heart palpitations     Past Surgical History:  Procedure Laterality Date   ANAL FISTULOTOMY N/A 10/01/2016   Procedure: ANAL FISTULOTOMY;  Surgeon: Mavis Anes, MD;   Location: [facility];  Service: General;  Laterality: N/A;   BIOPSY  06/12/2017   Procedure: BIOPSY;  Surgeon: Golda Claudis PENNER, MD;  Location: [facility];  Service: Endoscopy;;  gastric    ESOPHAGOGASTRODUODENOSCOPY N/A 06/12/2017   Procedure: ESOPHAGOGASTRODUODENOSCOPY (EGD);  Surgeon: Golda Claudis PENNER, MD;  Location: [facility];  Service: Endoscopy;  Laterality: N/A;  1:20pm-moved to 2/14 @ 2:00pm per Jenkins   HEMORRHOID SURGERY  10/01/2016   Procedure: HEMORRHOIDECTOMY;  Surgeon: Mavis Anes, MD;  Location: [facility];  Service: General;;    Current Outpatient Medications  Medication Sig Dispense Refill   Aspirin  81 MG CAPS Take 1 tablet by mouth daily. 90 capsule 3   EPINEPHrine 0.3 mg/0.3 mL IJ SOAJ injection Inject 0.3 mg into the muscle as needed.     ibuprofen  (ADVIL ,MOTRIN ) 200 MG tablet Take 400-600 mg by mouth every 6 (six) hours as needed for headache (depends on pain if takes 2-3 tablets).     LORazepam (ATIVAN) 1 MG tablet Take 1 mg by mouth 2 (two) times daily.     metoprolol tartrate (LOPRESSOR) 50 MG tablet Take 50 mg by mouth 2 (two) times daily.  3   omeprazole (PRILOSEC) 20 MG capsule Take 20 mg by mouth daily.     PARoxetine (PAXIL) 10 MG tablet Take 10 mg by mouth daily.     PARoxetine (PAXIL) 20 MG tablet Take 20 mg by mouth daily. TAKES 27.5MG   1   No  current facility-administered medications for this visit.   Allergies:  Bee venom, Cefdinir, Ondansetron hcl, Penicillins, Corticosteroids, Other, and Caffeine   Social History: The patient  reports that he has never smoked. He has never used smokeless tobacco. He reports that he does not drink alcohol and does not use drugs.   Family History: The patient's family history includes Leukemia in his father; Stroke in his father.   ROS:  Please see the history of present illness. Otherwise, complete review of systems is positive for none  All other systems are reviewed and negative.   Physical Exam: VS:  BP 104/70   Pulse  (!) 57   Ht 5' 7 (1.702 m)   Wt 146 lb 6.4 oz (66.4 kg)   SpO2 96%   BMI 22.93 kg/m , BMI Body mass index is 22.93 kg/m.  Wt Readings from Last 3 Encounters:  11/26/23 146 lb 6.4 oz (66.4 kg)  10/21/23 196 lb (88.9 kg)  08/12/23 209 lb (94.8 kg)    General: Patient appears comfortable at rest. HEENT: Conjunctiva and lids normal, oropharynx clear with moist mucosa. Neck: Supple, no elevated JVP or carotid bruits, no thyromegaly. Lungs: Clear to auscultation, nonlabored breathing at rest. Cardiac: Regular rate and rhythm, no S3 or significant systolic murmur, no pericardial rub. Abdomen: Soft, nontender, no hepatomegaly, bowel sounds present, no guarding or rebound. Extremities: No pitting edema, distal pulses 2+. Skin: Warm and dry. Musculoskeletal: No kyphosis. Neuropsychiatric: Alert and oriented x3, affect grossly appropriate.  Recent Labwork: No results found for requested labs within last 365 days.  No results found for: CHOL, TRIG, HDL, CHOLHDL, VLDL, LDLCALC, LDLDIRECT   Assessment and Plan:  Chest pain and DOE: Improved.  Echo and NM stress test unremarkable.  No evidence of ischemia.  There is hypertensive response to blood pressure noted.  He reported that he is checking blood pressures after exercise which has been normal and not as elevated as 190 mmHg when he had stress test.  Discussed symptoms of CAD and MI.  ER precautions for chest pain provided.  Elevated coronary calcium  score: Calcium  score is 73, 80th percentile for age and sex matched control.  Continue aspirin  81 mg once daily, start atorvastatin  10 mg 3 times a week.  Heart healthy diet and exercise recommendations provided.  HLD: LDL more than 100, start atorvastatin  10 mg 3 times a week.  Palpitations: Ongoing palpitations and dizziness sometimes.  He will call the clinic if he needs a 2-week event monitor.   Reviewed and discussed and stress test, echocardiogram and CT cardiac findings  with the patient.  I answered all his questions.  30 minutes spent with the patient in the exam room.  5 minutes spent in reviewing the images/reports and 5 minutes spent in documentation.     Medication Adjustments/Labs and Tests Ordered: Current medicines are reviewed at length with the patient today.  Concerns regarding medicines are outlined above.    Disposition:  Follow up PRN  Signed Khalil Belote Arleta Maywood, MD, 11/26/2023 3:15 PM    [facility] [address] [place], [place], [place] [address]

## 2023-11-28 DIAGNOSIS — R931 Abnormal findings on diagnostic imaging of heart and coronary circulation: Secondary | ICD-10-CM | POA: Insufficient documentation

## 2024-02-07 ENCOUNTER — Other Ambulatory Visit: Payer: Self-pay

## 2024-02-10 ENCOUNTER — Other Ambulatory Visit: Payer: Self-pay | Admitting: Internal Medicine

## 2024-02-10 MED ORDER — ASPIRIN 81 MG PO CAPS: 1.0000 | ORAL_CAPSULE | Freq: Every day | ORAL | 3 refills | Status: AC

## 2024-02-11 ENCOUNTER — Other Ambulatory Visit: Payer: Self-pay | Admitting: Internal Medicine

## 2024-02-17 ENCOUNTER — Other Ambulatory Visit: Payer: Self-pay

## 2024-02-24 ENCOUNTER — Telehealth: Payer: Self-pay | Admitting: Pharmacy Technician

## 2024-02-24 ENCOUNTER — Other Ambulatory Visit: Payer: Self-pay

## 2024-02-24 MED ORDER — ATORVASTATIN CALCIUM 10 MG PO TABS: 10.0000 mg | ORAL_TABLET | ORAL | 1 refills | Status: AC

## 2024-02-24 NOTE — Telephone Encounter (Signed)
   Refill requested from pharmacy for atorvastatin

## 2024-02-26 ENCOUNTER — Encounter (INDEPENDENT_AMBULATORY_CARE_PROVIDER_SITE_OTHER): Payer: Self-pay | Admitting: Gastroenterology
